# Patient Record
Sex: Female | Born: 1935
Health system: Southern US, Community
[De-identification: ages and names within clinical notes are randomized; demographics above are authoritative.]

## PROBLEM LIST (undated history)

## (undated) DIAGNOSIS — I10 Essential (primary) hypertension: Secondary | ICD-10-CM

## (undated) DIAGNOSIS — I219 Acute myocardial infarction, unspecified: Secondary | ICD-10-CM

## (undated) DIAGNOSIS — E162 Hypoglycemia, unspecified: Secondary | ICD-10-CM

## (undated) DIAGNOSIS — C801 Malignant (primary) neoplasm, unspecified: Secondary | ICD-10-CM

## (undated) DIAGNOSIS — I639 Cerebral infarction, unspecified: Secondary | ICD-10-CM

## (undated) HISTORY — PX: TONSILLECTOMY: SUR1361

## (undated) HISTORY — PX: BREAST SURGERY: SHX581

---

## 2011-04-03 DIAGNOSIS — I059 Rheumatic mitral valve disease, unspecified: Secondary | ICD-10-CM | POA: Diagnosis not present

## 2011-04-03 DIAGNOSIS — I10 Essential (primary) hypertension: Secondary | ICD-10-CM | POA: Diagnosis not present

## 2011-04-24 ENCOUNTER — Encounter (HOSPITAL_COMMUNITY): Payer: Self-pay

## 2011-04-24 ENCOUNTER — Emergency Department (HOSPITAL_COMMUNITY): Payer: Federal, State, Local not specified - PPO

## 2011-04-24 ENCOUNTER — Emergency Department (HOSPITAL_COMMUNITY)
Admission: EM | Admit: 2011-04-24 | Discharge: 2011-04-24 | Disposition: A | Discharge status: Other Acute Inpatient Hospital | Payer: Federal, State, Local not specified - PPO | Attending: Emergency Medicine | Admitting: Emergency Medicine

## 2011-04-24 ENCOUNTER — Other Ambulatory Visit: Payer: Self-pay

## 2011-04-24 DIAGNOSIS — Z853 Personal history of malignant neoplasm of breast: Secondary | ICD-10-CM | POA: Diagnosis not present

## 2011-04-24 DIAGNOSIS — Z882 Allergy status to sulfonamides status: Secondary | ICD-10-CM | POA: Diagnosis not present

## 2011-04-24 DIAGNOSIS — E162 Hypoglycemia, unspecified: Secondary | ICD-10-CM | POA: Diagnosis not present

## 2011-04-24 DIAGNOSIS — R918 Other nonspecific abnormal finding of lung field: Secondary | ICD-10-CM | POA: Diagnosis not present

## 2011-04-24 DIAGNOSIS — I214 Non-ST elevation (NSTEMI) myocardial infarction: Secondary | ICD-10-CM | POA: Diagnosis not present

## 2011-04-24 DIAGNOSIS — R0602 Shortness of breath: Secondary | ICD-10-CM | POA: Insufficient documentation

## 2011-04-24 DIAGNOSIS — Z91018 Allergy to other foods: Secondary | ICD-10-CM | POA: Diagnosis not present

## 2011-04-24 DIAGNOSIS — I2119 ST elevation (STEMI) myocardial infarction involving other coronary artery of inferior wall: Secondary | ICD-10-CM | POA: Diagnosis not present

## 2011-04-24 DIAGNOSIS — Z823 Family history of stroke: Secondary | ICD-10-CM | POA: Diagnosis not present

## 2011-04-24 DIAGNOSIS — R0609 Other forms of dyspnea: Secondary | ICD-10-CM | POA: Diagnosis not present

## 2011-04-24 DIAGNOSIS — R5381 Other malaise: Secondary | ICD-10-CM | POA: Insufficient documentation

## 2011-04-24 DIAGNOSIS — Z923 Personal history of irradiation: Secondary | ICD-10-CM | POA: Diagnosis not present

## 2011-04-24 DIAGNOSIS — I359 Nonrheumatic aortic valve disorder, unspecified: Secondary | ICD-10-CM | POA: Diagnosis not present

## 2011-04-24 DIAGNOSIS — Z880 Allergy status to penicillin: Secondary | ICD-10-CM | POA: Diagnosis not present

## 2011-04-24 DIAGNOSIS — I10 Essential (primary) hypertension: Secondary | ICD-10-CM | POA: Diagnosis not present

## 2011-04-24 DIAGNOSIS — R42 Dizziness and giddiness: Secondary | ICD-10-CM | POA: Insufficient documentation

## 2011-04-24 HISTORY — DX: Malignant (primary) neoplasm, unspecified: C80.1

## 2011-04-24 HISTORY — DX: Essential (primary) hypertension: I10

## 2011-04-24 LAB — URINALYSIS, ROUTINE W REFLEX MICROSCOPIC
Bilirubin Urine: NEGATIVE
Ketones, ur: NEGATIVE mg/dL
Nitrite: NEGATIVE
Urobilinogen, UA: 0.2 mg/dL (ref 0.0–1.0)

## 2011-04-24 LAB — URINE MICROSCOPIC-ADD ON

## 2011-04-24 LAB — BASIC METABOLIC PANEL
BUN: 13 mg/dL (ref 6–23)
Calcium: 9.2 mg/dL (ref 8.4–10.5)
Creatinine, Ser: 0.84 mg/dL (ref 0.50–1.10)
GFR calc Af Amer: 76 mL/min — ABNORMAL LOW (ref >90)
GFR calc non Af Amer: 66 mL/min — ABNORMAL LOW (ref >90)

## 2011-04-24 LAB — POCT I-STAT TROPONIN I

## 2011-04-24 LAB — DIFFERENTIAL
Basophils Relative: 0 % (ref 0–1)
Eosinophils Absolute: 0 10*3/uL (ref 0.0–0.7)
Monocytes Absolute: 1.5 10*3/uL — ABNORMAL HIGH (ref 0.1–1.0)
Monocytes Relative: 11 % (ref 3–12)

## 2011-04-24 LAB — CBC
HCT: 36.9 % (ref 36.0–46.0)
Hemoglobin: 12.4 g/dL (ref 12.0–15.0)
MCH: 29.2 pg (ref 26.0–34.0)
MCHC: 33.6 g/dL (ref 30.0–36.0)

## 2011-04-24 LAB — CARDIAC PANEL(CRET KIN+CKTOT+MB+TROPI): Troponin I: 9.85 ng/mL (ref <0.30)

## 2011-04-24 MED ORDER — HEPARIN BOLUS VIA INFUSION
4000.0000 [IU] | Freq: Once | INTRAVENOUS | Status: AC
  Administered 2011-04-24: 4000 [IU] via INTRAVENOUS

## 2011-04-24 MED ORDER — POTASSIUM CHLORIDE CRYS ER 20 MEQ PO TBCR
40.0000 meq | EXTENDED_RELEASE_TABLET | Freq: Once | ORAL | Status: AC
  Administered 2011-04-24: 40 meq via ORAL
  Filled 2011-04-24: qty 2

## 2011-04-24 MED ORDER — ASPIRIN 81 MG PO CHEW
324.0000 mg | CHEWABLE_TABLET | Freq: Once | ORAL | Status: AC
  Administered 2011-04-24: 324 mg via ORAL
  Filled 2011-04-24: qty 1

## 2011-04-24 MED ORDER — VITAMIN D3 25 MCG (1000 UNIT) PO TABS
1000.0000 [IU] | ORAL_TABLET | Freq: Every day | ORAL | Status: DC
  Administered 2011-04-24: 1000 [IU] via ORAL
  Filled 2011-04-24: qty 1

## 2011-04-24 MED ORDER — METOPROLOL SUCCINATE ER 50 MG PO TB24
75.0000 mg | ORAL_TABLET | Freq: Every day | ORAL | Status: DC
  Filled 2011-04-24: qty 1

## 2011-04-24 MED ORDER — HEPARIN (PORCINE) IN NACL 100-0.45 UNIT/ML-% IJ SOLN
1000.0000 [IU]/h | INTRAMUSCULAR | Status: DC
  Administered 2011-04-24: 1000 [IU]/h via INTRAVENOUS
  Filled 2011-04-24: qty 250

## 2011-04-24 MED ORDER — ASPIRIN EC 81 MG PO TBEC
81.0000 mg | DELAYED_RELEASE_TABLET | Freq: Every day | ORAL | Status: DC
  Filled 2011-04-24: qty 1

## 2011-04-24 MED ORDER — SODIUM CHLORIDE 0.9 % IV SOLN
INTRAVENOUS | Status: DC
  Administered 2011-04-24: 09:00:00 via INTRAVENOUS

## 2011-04-24 MED ORDER — NITROGLYCERIN 2 % TD OINT
1.0000 [in_us] | TOPICAL_OINTMENT | Freq: Once | TRANSDERMAL | Status: AC
  Administered 2011-04-24: 1 [in_us] via TOPICAL
  Filled 2011-04-24: qty 30

## 2011-04-24 NOTE — ED Notes (Signed)
Pt will be transferred to [facility] CCU---Dr. Misty Stanley.  Report given to [facility], Charity fundraiser.

## 2011-04-24 NOTE — ED Provider Notes (Signed)
History     CSN: [id number]  Arrival date & time 04/24/11  4098   First MD Initiated Contact with Patient 04/24/11 [phone]      Chief Complaint  Patient presents with  . Weakness    (Consider location/radiation/quality/duration/timing/severity/associated sxs/prior treatment) Patient is a 78 y.o. female presenting with weakness. The history is provided by the patient.  Weakness The primary symptoms include nausea. Primary symptoms do not include headaches, fever or vomiting.  Additional symptoms include weakness.   the patient is a 78 year old female, with a history of hypertension.  She presents to the emergency department complaining of a feeling as if she will pass out, along with weakness.  Her symptoms began 2 days ago in the morning after she will call.  She was walking to the bathroom and felt as if she will pass out.  She also had pain in her tooth and left elbow.  She has had decreased appetite for the past 2 days.  Today.  She was walking to work.  She felt nauseated, and had shortness of breath.  She says she had to stop and sit down on the ground and then, her symptoms resolved.  She denied chest pain or pain anywhere else.  She also says that she has been diaphoretic.  While being at home.  She denies cough, fevers, chills, urinary tract symptoms, vomiting, or diarrhea.  She does not smoke.  She denies diabetes.  She denies prior history of coronary artery disease, or congestive heart failure.  She takes aspirin daily.  Her last aspirin was last night at 10:30.  No past medical history on file.  No past surgical history on file.  No family history on file.  History  Substance Use Topics  . Smoking status: Not on file  . Smokeless tobacco: Not on file  . Alcohol Use: Not on file    OB History    No data available      Review of Systems  Constitutional: Positive for diaphoresis. Negative for fever and chills.  HENT: Negative for congestion.   Respiratory: Positive for  shortness of breath. Negative for cough and chest tightness.        Dyspnea on exertion  Cardiovascular: Negative for chest pain, palpitations and leg swelling.  Gastrointestinal: Positive for nausea. Negative for vomiting, abdominal pain and diarrhea.  Genitourinary: Negative for dysuria.  Neurological: Positive for weakness and light-headedness. Negative for headaches.  Psychiatric/Behavioral: Negative for confusion.  All other systems reviewed and are negative.    Allergies  Ampicillin; Penicillins; and Sulfa drugs cross reactors  Home Medications  No current outpatient prescriptions on file.  BP 98/44  Pulse 64  Temp(Src) 98.2 F (36.8 C) (Oral)  Resp 20  SpO2 97%  Physical Exam  Vitals reviewed. Constitutional: She is oriented to person, place, and time. She appears well-developed and well-nourished. No distress.  HENT:  Head: Normocephalic and atraumatic.  Eyes: Conjunctivae are normal. Pupils are equal, round, and reactive to light.  Neck: Normal range of motion.  Cardiovascular: Normal rate and regular rhythm.   Pulmonary/Chest: Effort normal and breath sounds normal. No respiratory distress. She has no wheezes. She has no rales.  Abdominal: Soft. Bowel sounds are normal. She exhibits no distension. There is no tenderness.  Musculoskeletal: Normal range of motion. She exhibits no edema.  Neurological: She is alert and oriented to person, place, and time. No cranial nerve deficit.  Skin: Skin is warm and dry.  Psychiatric: She has a normal mood  and affect. Judgment and thought content normal.    ED Course  Procedures (including critical care time) 78 year old female, with hypertension, presents with generalized weakness, dyspnea on exertion, and nausea.  Symptoms resolved after resting.  She has no history of coronary disease.  She did have pain in her tooth in the left elbow 2 days ago, as well.  We will do generalized evaluation for weakness, with blood test, and a  urinalysis, but now also concerned with her dyspnea on exertion.  That resolved with resting.  This may be a sign of acute coronary syndrome in an elderly patient.  We will do an EKG, and cardiac enzymes, and a chest x-ray, as well.  Presently, she is asymptomatic, so.  No medical therapy is indicated at this time.  She has mild hypotension, perhaps due to the diuretic that she takes.  We will treat her with IV fluids while she is in emergency department.     Labs Reviewed  CBC  DIFFERENTIAL  BASIC METABOLIC PANEL  URINALYSIS, ROUTINE W REFLEX MICROSCOPIC  CARDIAC PANEL(CRET KIN+CKTOT+MB+TROPI)   ED ECG REPORT   Date: 04/24/2011  EKG Time: 8:17 AM  Rate: 65  Rhythm: normal sinus rhythm,    Axis: nl  Intervals:none  ST&T Change: tw inversions inferior leads  Narrative Interpretation: nsr with inf q waves and tw inversion inferiorly       9:04 AM Spoke with [facility] about NSTEMI.  They will come admit.  Discussed with pt. She still has NO CP.  Will give asa, heparin, nitro oint to chest wall.     No diagnosis found.  CRITICAL CARE Performed by: Nicholes Stairs   Total critical care time: 30 min  Critical care time was exclusive of separately billable procedures and treating other patients.  Critical care was necessary to treat or prevent imminent or life-threatening deterioration.  Critical care was time spent personally by me on the following activities: development of treatment plan with patient and/or surrogate as well as nursing, discussions with consultants, evaluation of patient's response to treatment, examination of patient, obtaining history from patient or surrogate, ordering and performing treatments and interventions, ordering and review of laboratory studies, ordering and review of radiographic studies, pulse oximetry and re-evaluation of patient's condition.   MDM  NSTEMI Currently no cp.   Mild hypotension.         Nicholes Stairs,  MD 04/24/11 [phone]

## 2011-04-24 NOTE — H&P (Addendum)
History and Physical  Patient ID: Kaitlyn Burke Patient ID: Kaitlyn Burke MRN: [id number], DOB/AGE: [birth year] 76 y.o. Date of Encounter: 04/24/2011  Primary Physician: No primary provider on file. Primary Cardiologist: Dr. Therisa Burke, [facility]  Chief Complaint: weakness and SOB on exertion  HPI: 78 y/o female with no prior h/o cardiac disease, s/p remote cardiac catheterization. She was in her usual state of health until 04/22/11 when she awoke at 4 am with pain in her L upper jaw and L elbow pain, nausea but w/o vomiting, and dizzy/presyncopal. She was u/a to get OOB to go to the bathroom because she felt so nauseated and weak. The symptoms improved some after several hours without intervention, but she continued to feel poorly, with generalized weakness and nausea. Since that time, she has had decreased appetite and energy level. She did not go to work yesterday because she continued feeling poorly. This morning she was walking about a block to work and had onset of weakness and SOB. She was unable to continue and sxs improved after she sat to rest. A co-worker called EMS. Her CEs were elevated in the ED at [facility]. She was treated with 324 mg of ASA, Heparin, and Nitro 2% ointment. She is asymptomatic with exception of some SOB, now on 2L O2 [place]. Her EKG shows Q waves in the inferior leads, II, III, and AvF. She has never had sxs like this before. At no point did she have chest pain. She has never had chest pain.  Of note, she last saw Dr. Claris Burke on 04/03/11. [facility] contacted Dr. Selena Batten [phone] who is on-call for Dr. Claris Burke to discuss transfer.  Hx:                                                                  SEM Breast CA HTN Hyperlipidemia     Surgical History: Lumpectomy, choley, uterine polyp, T&A, heart cath - no obstructive disease.  I have reviewed the patient's current medications. Scheduled Meds:   . aspirin  324 mg Oral Once  . heparin  4,000  Units Intravenous Once  . nitroGLYCERIN  1 inch Topical Once   Continuous Infusions:   . sodium chloride 125 mL/hr at 04/24/11 0851  . heparin 1,000 Units/hr (04/24/11 0948)   Medications Prior to Admission  Medication Dose Route Frequency Provider Last Rate Last Dose  . ASA 81 mg        .         .         .         .         .         .         .          No current outpatient prescriptions on file as of 04/24/2011.     Allergies:  Allergies  Allergen Reactions  . Ampicillin Rash  . Penicillins Rash  . Sulfa Drugs Cross Reactors Rash    History   Social History  . Marital Status: Widow    Spouse Name: N/A    Number of Children: N/A  . Years of Education: N/A   Occupational History  . Works at [employer]  Social History Main Topics  . Smoking status: No  . Smokeless tobacco: No  . Alcohol Use: No  . Drug Use:   . Sexually Active:    Social History Narrative  . Lives alone in [place]     Family history: Mother deceased after stroke, Father died at 44, siblings no CAD/CVA   Physical Exam: Blood pressure 98/44, pulse 64, temperature 98.2 F (36.8 C), temperature source Oral, resp. rate 20, weight 152 lb (68.947 kg), SpO2 97.00%. General: Well developed, well nourished, in no acute distress. Head: Normocephalic, atraumatic, sclera non-icteric, no xanthomas, nares are without discharge.  Neck:carotid bruits. JVD not elevated. No thyromegally Lungs: Clear bilaterally to auscultation without wheezes, rales, or rhonchi.  Heart: RRR with S1 S2. No murmurs, rubs, or gallops appreciated. Abdomen: Soft, non-tender, non-distended with normoactive bowel sounds. No hepatomegaly. No rebound/guarding. No obvious abdominal masses. Msk:  Strength and tone appear normal for age. No joint deformities or effusions, no spine or costo-vertebral angle tenderness. Extremities: No clubbing or cyanosis. No edema.  Distal pedal pulses are 2+ and equal bilaterally. Neuro:  Alert and oriented X 3. Moves all extremities spontaneously. No focal deficits noted. Psych:  Responds to questions appropriately with a normal affect. Skin: No rashes or lesions noted  Review of Systems:  All other systems reviewed and are otherwise negative except as noted above.  Labs:   Lab Results  Component Value Date   WBC 12.9* 04/24/2011   HGB 12.4 04/24/2011   HCT 36.9 04/24/2011   MCV 87.0 04/24/2011   PLT 222 04/24/2011   No results found for this basename: INR in the last 72 hours  Lab 04/24/11 0820  NA 136  K 3.2*  CL 99  CO2 28  BUN 13  CREATININE 0.84  CALCIUM 9.2  PROT --  BILITOT --  ALKPHOS --  ALT --  AST --  GLUCOSE 119*    Basename 04/24/11 0820  CKTOTAL 588*  CKMB 10.3*  TROPONINI 9.85*   Radiology/Studies:  Dg Chest Port 1 View  04/24/2011  *RADIOLOGY REPORT*  Clinical Data: Hypoglycemia  PORTABLE CHEST - 1 VIEW  Comparison: None.  Findings: Normal mediastinum and heart silhouette. There is a faint opacity in the right lower lobe inferolaterally.  No focal consolidation.  No pneumothorax.  No pulmonary edema.  No acute osseous abnormality.   IMPRESSION: Faint opacity in the right lower lobe.  Cannot exclude early pneumonia.  Original Report Authenticated By: Kaitlyn Burke, M.D.    EKG: SR, rate 65BPM with inferior Q waves and inferior T wave changes - no old available for comparison  ASSESSMENT AND PLAN:  1. NSTEMI: Medical Rx for now with ASA, Heparin, NTG, oxygen. 2. abnl CXR - do 2-view.  SignedBjorn Loser Jabari Swoveland PA-C 04/24/2011, 10:06 AM  Cardiology Attending Patient seen and examined. I have reviewed the history and physical exam as noted by Kaitlyn Demark, PA-C. She almost certainly sustained a MI 3-4 days ago, and is now pain free. She has an elevated troponin and inferior Q's with minimal residual ST elevation. She has mild post-infarction sob but is not in overt CHF. Otherwise her exam is unremarkable. She has requested transfer to  [facility] Dr. Claris Burke for post-MI care. Will transfer once a bed is available. Will continue meds as outlined above.  **Spoke with Kaitlyn Burke at [facility]. Pt has a bed in CCU 11 at [facility]. Called [facility] and Pam (her RN at [employer]) to let them know. Will hold off on 2-View CXR here  and let them evaluate her at [facility] for abnormal CXR. The accepting MD is aware. Kaitlyn Loser Annisha Baar 04/24/2011 11:50 AM

## 2011-04-24 NOTE — Progress Notes (Signed)
ANTICOAGULATION CONSULT NOTE - Initial Consult  Pharmacy Consult for Heparin Indication: chest pain/ACS  Allergies  Allergen Reactions  . Ampicillin Rash  . Penicillins Rash  . Sulfa Drugs Cross Reactors Rash    Patient Measurements: Weight: 152 lb (68.947 kg)  Vital Signs: Temp: 98.2 F (36.8 C) (01/29 0731) Temp src: Oral (01/29 0731) BP: 98/44 mmHg (01/29 0731) Pulse Rate: 64  (01/29 0731)  Labs:  Basename 04/24/11 0820  HGB 12.4  HCT 36.9  PLT 222  APTT --  LABPROT --  INR --  HEPARINUNFRC --  CREATININE 0.84  CKTOTAL --  CKMB --  TROPONINI --   CrCl is unknown because there is no height on file for the current visit.  Medical History: Hypertension  Medications:  Scheduled:    . aspirin  324 mg Oral Once  . heparin  4,000 Units Intravenous Once  . nitroGLYCERIN  1 inch Topical Once   Infusions:    . sodium chloride 125 mL/hr at 04/24/11 0851  . heparin      Assessment: 78 yo female with elevated troponin 10.65 (poc ED). Complaint of nausea,SOB,diaphoresis,left elbow and jaw pain. Sx began two days ago.  Goal of Therapy:  Heparin level 0.3-0.7 units/ml   Plan:  Heparin 4000 unit load and infusion at 1000 units/hr per ER MD. (Calculated infusion rate 828 units/hr at 12 units/kg/hr). Heparin level in 8 hr and daily ordered. Daily CBC while on heparin.   Otho Bellows PharmD 04/24/2011,9:24 AM

## 2011-04-24 NOTE — ED Notes (Signed)
[id number]<AV> Expected date:04/24/11<BR> Expected time: 7:05 AM<BR> Means of arrival:Ambulance<BR> Comments:<BR> flulike sx

## 2011-04-24 NOTE — ED Notes (Signed)
Pt began not feeling well over the weekend....nausea---no vomiting; diarrhea, cold symptoms, and weakness.  Pt attempted to go to work today, but was so weak that she could not walk the entire distance to her office.  She had to sit down, and a co-worker called EMS.  She has not been eating or drinking well during this time.  Left elbow has been hurting, as well as left jaw.  Pt has  A hx of a heart murmur.  She did c/o heartburn yesterday.  No SOB.

## 2011-04-24 NOTE — ED Notes (Signed)
[facility] has possession of the patient.  [place] forms sent and patient signature sheet sent approving the transfer.  The transfer sheet was signed electronically as well, but would not print. CCU at [facility] notified that the pt is on the way.

## 2011-04-24 NOTE — ED Notes (Signed)
MD Caporossi made aware of critical i-Stat troponin value.

## 2011-04-24 NOTE — ED Notes (Signed)
Urine was a clean catch----not cath.

## 2011-06-09 DIAGNOSIS — R0602 Shortness of breath: Secondary | ICD-10-CM | POA: Diagnosis not present

## 2011-06-09 DIAGNOSIS — R209 Unspecified disturbances of skin sensation: Secondary | ICD-10-CM | POA: Diagnosis not present

## 2011-06-09 DIAGNOSIS — I2119 ST elevation (STEMI) myocardial infarction involving other coronary artery of inferior wall: Secondary | ICD-10-CM | POA: Diagnosis not present

## 2011-06-09 DIAGNOSIS — J019 Acute sinusitis, unspecified: Secondary | ICD-10-CM | POA: Diagnosis not present

## 2011-06-19 DIAGNOSIS — I10 Essential (primary) hypertension: Secondary | ICD-10-CM | POA: Diagnosis not present

## 2014-07-05 DIAGNOSIS — H35033 Hypertensive retinopathy, bilateral: Secondary | ICD-10-CM | POA: Diagnosis not present

## 2014-07-05 DIAGNOSIS — H2513 Age-related nuclear cataract, bilateral: Secondary | ICD-10-CM | POA: Diagnosis not present

## 2014-07-05 DIAGNOSIS — H04123 Dry eye syndrome of bilateral lacrimal glands: Secondary | ICD-10-CM | POA: Diagnosis not present

## 2014-07-23 DIAGNOSIS — Z1231 Encounter for screening mammogram for malignant neoplasm of breast: Secondary | ICD-10-CM | POA: Diagnosis not present

## 2014-08-12 DIAGNOSIS — Z23 Encounter for immunization: Secondary | ICD-10-CM | POA: Diagnosis not present

## 2014-08-12 DIAGNOSIS — E780 Pure hypercholesterolemia: Secondary | ICD-10-CM | POA: Diagnosis not present

## 2014-08-12 DIAGNOSIS — I10 Essential (primary) hypertension: Secondary | ICD-10-CM | POA: Diagnosis not present

## 2014-08-12 DIAGNOSIS — M8580 Other specified disorders of bone density and structure, unspecified site: Secondary | ICD-10-CM | POA: Diagnosis not present

## 2014-08-12 DIAGNOSIS — I252 Old myocardial infarction: Secondary | ICD-10-CM | POA: Diagnosis not present

## 2014-08-19 DIAGNOSIS — Z23 Encounter for immunization: Secondary | ICD-10-CM | POA: Diagnosis not present

## 2014-10-22 DIAGNOSIS — M79602 Pain in left arm: Secondary | ICD-10-CM | POA: Diagnosis not present

## 2014-10-22 DIAGNOSIS — M5030 Other cervical disc degeneration, unspecified cervical region: Secondary | ICD-10-CM | POA: Diagnosis not present

## 2014-10-22 DIAGNOSIS — M47812 Spondylosis without myelopathy or radiculopathy, cervical region: Secondary | ICD-10-CM | POA: Diagnosis not present

## 2014-10-22 DIAGNOSIS — R200 Anesthesia of skin: Secondary | ICD-10-CM | POA: Diagnosis not present

## 2014-10-22 DIAGNOSIS — M4722 Other spondylosis with radiculopathy, cervical region: Secondary | ICD-10-CM | POA: Diagnosis not present

## 2014-10-22 DIAGNOSIS — M542 Cervicalgia: Secondary | ICD-10-CM | POA: Diagnosis not present

## 2014-10-22 DIAGNOSIS — K219 Gastro-esophageal reflux disease without esophagitis: Secondary | ICD-10-CM | POA: Diagnosis not present

## 2014-11-12 DIAGNOSIS — Z170 Estrogen receptor positive status [ER+]: Secondary | ICD-10-CM | POA: Diagnosis not present

## 2014-11-12 DIAGNOSIS — C50212 Malignant neoplasm of upper-inner quadrant of left female breast: Secondary | ICD-10-CM | POA: Diagnosis not present

## 2014-11-12 DIAGNOSIS — R202 Paresthesia of skin: Secondary | ICD-10-CM | POA: Diagnosis not present

## 2014-11-12 DIAGNOSIS — Z853 Personal history of malignant neoplasm of breast: Secondary | ICD-10-CM | POA: Diagnosis not present

## 2014-11-12 DIAGNOSIS — Z08 Encounter for follow-up examination after completed treatment for malignant neoplasm: Secondary | ICD-10-CM | POA: Diagnosis not present

## 2014-12-21 DIAGNOSIS — M5033 Other cervical disc degeneration, cervicothoracic region: Secondary | ICD-10-CM | POA: Diagnosis not present

## 2014-12-21 DIAGNOSIS — M47812 Spondylosis without myelopathy or radiculopathy, cervical region: Secondary | ICD-10-CM | POA: Diagnosis not present

## 2014-12-21 DIAGNOSIS — M5032 Other cervical disc degeneration, mid-cervical region: Secondary | ICD-10-CM | POA: Diagnosis not present

## 2014-12-21 DIAGNOSIS — M5031 Other cervical disc degeneration,  high cervical region: Secondary | ICD-10-CM | POA: Diagnosis not present

## 2014-12-21 DIAGNOSIS — M4802 Spinal stenosis, cervical region: Secondary | ICD-10-CM | POA: Diagnosis not present

## 2015-01-18 DIAGNOSIS — J3489 Other specified disorders of nose and nasal sinuses: Secondary | ICD-10-CM | POA: Diagnosis not present

## 2015-01-18 DIAGNOSIS — J029 Acute pharyngitis, unspecified: Secondary | ICD-10-CM | POA: Diagnosis not present

## 2015-01-18 DIAGNOSIS — K210 Gastro-esophageal reflux disease with esophagitis: Secondary | ICD-10-CM | POA: Diagnosis not present

## 2015-01-18 DIAGNOSIS — R142 Eructation: Secondary | ICD-10-CM | POA: Diagnosis not present

## 2015-01-20 DIAGNOSIS — K219 Gastro-esophageal reflux disease without esophagitis: Secondary | ICD-10-CM | POA: Diagnosis not present

## 2015-01-20 DIAGNOSIS — J029 Acute pharyngitis, unspecified: Secondary | ICD-10-CM | POA: Diagnosis not present

## 2015-01-20 DIAGNOSIS — K5150 Left sided colitis without complications: Secondary | ICD-10-CM | POA: Diagnosis not present

## 2015-02-03 DIAGNOSIS — K2950 Unspecified chronic gastritis without bleeding: Secondary | ICD-10-CM | POA: Diagnosis not present

## 2015-02-03 DIAGNOSIS — D124 Benign neoplasm of descending colon: Secondary | ICD-10-CM | POA: Diagnosis not present

## 2015-02-03 DIAGNOSIS — K5190 Ulcerative colitis, unspecified, without complications: Secondary | ICD-10-CM | POA: Diagnosis not present

## 2015-02-03 DIAGNOSIS — D123 Benign neoplasm of transverse colon: Secondary | ICD-10-CM | POA: Diagnosis not present

## 2015-02-03 DIAGNOSIS — K219 Gastro-esophageal reflux disease without esophagitis: Secondary | ICD-10-CM | POA: Diagnosis not present

## 2015-02-03 DIAGNOSIS — D122 Benign neoplasm of ascending colon: Secondary | ICD-10-CM | POA: Diagnosis not present

## 2015-02-03 DIAGNOSIS — Z86010 Personal history of colonic polyps: Secondary | ICD-10-CM | POA: Diagnosis not present

## 2015-02-08 DIAGNOSIS — Z880 Allergy status to penicillin: Secondary | ICD-10-CM | POA: Diagnosis not present

## 2015-02-08 DIAGNOSIS — I4510 Unspecified right bundle-branch block: Secondary | ICD-10-CM | POA: Diagnosis not present

## 2015-02-08 DIAGNOSIS — I10 Essential (primary) hypertension: Secondary | ICD-10-CM | POA: Diagnosis not present

## 2015-02-08 DIAGNOSIS — Z79899 Other long term (current) drug therapy: Secondary | ICD-10-CM | POA: Diagnosis not present

## 2015-02-08 DIAGNOSIS — Z888 Allergy status to other drugs, medicaments and biological substances status: Secondary | ICD-10-CM | POA: Diagnosis not present

## 2015-02-08 DIAGNOSIS — Z882 Allergy status to sulfonamides status: Secondary | ICD-10-CM | POA: Diagnosis not present

## 2015-02-08 DIAGNOSIS — R012 Other cardiac sounds: Secondary | ICD-10-CM | POA: Diagnosis not present

## 2015-02-08 DIAGNOSIS — Z7982 Long term (current) use of aspirin: Secondary | ICD-10-CM | POA: Diagnosis not present

## 2015-03-17 DIAGNOSIS — I10 Essential (primary) hypertension: Secondary | ICD-10-CM | POA: Diagnosis not present

## 2015-03-17 DIAGNOSIS — M2560 Stiffness of unspecified joint, not elsewhere classified: Secondary | ICD-10-CM | POA: Diagnosis not present

## 2015-03-17 DIAGNOSIS — M899 Disorder of bone, unspecified: Secondary | ICD-10-CM | POA: Diagnosis not present

## 2015-03-17 DIAGNOSIS — M8580 Other specified disorders of bone density and structure, unspecified site: Secondary | ICD-10-CM | POA: Diagnosis not present

## 2015-03-17 DIAGNOSIS — Z0001 Encounter for general adult medical examination with abnormal findings: Secondary | ICD-10-CM | POA: Diagnosis not present

## 2015-03-17 DIAGNOSIS — R828 Abnormal findings on cytological and histological examination of urine: Secondary | ICD-10-CM | POA: Diagnosis not present

## 2015-03-17 DIAGNOSIS — Z0000 Encounter for general adult medical examination without abnormal findings: Secondary | ICD-10-CM | POA: Diagnosis not present

## 2015-03-17 DIAGNOSIS — M25512 Pain in left shoulder: Secondary | ICD-10-CM | POA: Diagnosis not present

## 2015-03-17 DIAGNOSIS — K210 Gastro-esophageal reflux disease with esophagitis: Secondary | ICD-10-CM | POA: Diagnosis not present

## 2015-03-17 DIAGNOSIS — F909 Attention-deficit hyperactivity disorder, unspecified type: Secondary | ICD-10-CM | POA: Diagnosis not present

## 2015-03-17 DIAGNOSIS — R5383 Other fatigue: Secondary | ICD-10-CM | POA: Diagnosis not present

## 2015-03-17 DIAGNOSIS — E559 Vitamin D deficiency, unspecified: Secondary | ICD-10-CM | POA: Diagnosis not present

## 2015-03-17 DIAGNOSIS — Z1289 Encounter for screening for malignant neoplasm of other sites: Secondary | ICD-10-CM | POA: Diagnosis not present

## 2015-03-30 DIAGNOSIS — Z780 Asymptomatic menopausal state: Secondary | ICD-10-CM | POA: Diagnosis not present

## 2015-03-30 DIAGNOSIS — M899 Disorder of bone, unspecified: Secondary | ICD-10-CM | POA: Diagnosis not present

## 2015-07-21 DIAGNOSIS — H2512 Age-related nuclear cataract, left eye: Secondary | ICD-10-CM | POA: Diagnosis not present

## 2015-07-21 DIAGNOSIS — G43809 Other migraine, not intractable, without status migrainosus: Secondary | ICD-10-CM | POA: Diagnosis not present

## 2015-07-21 DIAGNOSIS — H35033 Hypertensive retinopathy, bilateral: Secondary | ICD-10-CM | POA: Diagnosis not present

## 2015-07-21 DIAGNOSIS — H04123 Dry eye syndrome of bilateral lacrimal glands: Secondary | ICD-10-CM | POA: Diagnosis not present

## 2015-07-28 DIAGNOSIS — C50212 Malignant neoplasm of upper-inner quadrant of left female breast: Secondary | ICD-10-CM | POA: Diagnosis not present

## 2015-07-28 DIAGNOSIS — Z1231 Encounter for screening mammogram for malignant neoplasm of breast: Secondary | ICD-10-CM | POA: Diagnosis not present

## 2015-08-17 DIAGNOSIS — H2512 Age-related nuclear cataract, left eye: Secondary | ICD-10-CM | POA: Diagnosis not present

## 2015-09-14 DIAGNOSIS — E7800 Pure hypercholesterolemia, unspecified: Secondary | ICD-10-CM | POA: Diagnosis not present

## 2015-09-14 DIAGNOSIS — K210 Gastro-esophageal reflux disease with esophagitis: Secondary | ICD-10-CM | POA: Diagnosis not present

## 2015-09-14 DIAGNOSIS — I10 Essential (primary) hypertension: Secondary | ICD-10-CM | POA: Diagnosis not present

## 2015-09-14 DIAGNOSIS — R7309 Other abnormal glucose: Secondary | ICD-10-CM | POA: Diagnosis not present

## 2015-09-14 DIAGNOSIS — M8580 Other specified disorders of bone density and structure, unspecified site: Secondary | ICD-10-CM | POA: Diagnosis not present

## 2015-09-20 DIAGNOSIS — Z170 Estrogen receptor positive status [ER+]: Secondary | ICD-10-CM | POA: Diagnosis not present

## 2015-09-20 DIAGNOSIS — C50212 Malignant neoplasm of upper-inner quadrant of left female breast: Secondary | ICD-10-CM | POA: Diagnosis not present

## 2015-09-20 DIAGNOSIS — Z8041 Family history of malignant neoplasm of ovary: Secondary | ICD-10-CM | POA: Diagnosis not present

## 2015-09-20 DIAGNOSIS — Z9011 Acquired absence of right breast and nipple: Secondary | ICD-10-CM | POA: Diagnosis not present

## 2015-09-20 DIAGNOSIS — Z803 Family history of malignant neoplasm of breast: Secondary | ICD-10-CM | POA: Diagnosis not present

## 2015-10-19 DIAGNOSIS — Z880 Allergy status to penicillin: Secondary | ICD-10-CM | POA: Diagnosis not present

## 2015-10-19 DIAGNOSIS — Z882 Allergy status to sulfonamides status: Secondary | ICD-10-CM | POA: Diagnosis not present

## 2015-10-19 DIAGNOSIS — H2512 Age-related nuclear cataract, left eye: Secondary | ICD-10-CM | POA: Diagnosis not present

## 2015-10-19 DIAGNOSIS — I10 Essential (primary) hypertension: Secondary | ICD-10-CM | POA: Diagnosis not present

## 2015-10-19 DIAGNOSIS — Z79899 Other long term (current) drug therapy: Secondary | ICD-10-CM | POA: Diagnosis not present

## 2015-10-19 DIAGNOSIS — I252 Old myocardial infarction: Secondary | ICD-10-CM | POA: Diagnosis not present

## 2015-10-19 DIAGNOSIS — E785 Hyperlipidemia, unspecified: Secondary | ICD-10-CM | POA: Diagnosis not present

## 2015-10-19 DIAGNOSIS — I2510 Atherosclerotic heart disease of native coronary artery without angina pectoris: Secondary | ICD-10-CM | POA: Diagnosis not present

## 2015-10-19 DIAGNOSIS — K219 Gastro-esophageal reflux disease without esophagitis: Secondary | ICD-10-CM | POA: Diagnosis not present

## 2015-10-19 DIAGNOSIS — Z886 Allergy status to analgesic agent status: Secondary | ICD-10-CM | POA: Diagnosis not present

## 2015-10-19 DIAGNOSIS — H5703 Miosis: Secondary | ICD-10-CM | POA: Diagnosis not present

## 2015-10-31 DIAGNOSIS — I2510 Atherosclerotic heart disease of native coronary artery without angina pectoris: Secondary | ICD-10-CM | POA: Diagnosis not present

## 2015-10-31 DIAGNOSIS — R300 Dysuria: Secondary | ICD-10-CM | POA: Diagnosis not present

## 2015-10-31 DIAGNOSIS — I10 Essential (primary) hypertension: Secondary | ICD-10-CM | POA: Diagnosis not present

## 2015-10-31 DIAGNOSIS — N390 Urinary tract infection, site not specified: Secondary | ICD-10-CM | POA: Diagnosis not present

## 2015-11-18 ENCOUNTER — Other Ambulatory Visit: Payer: Self-pay

## 2015-12-05 DIAGNOSIS — N390 Urinary tract infection, site not specified: Secondary | ICD-10-CM | POA: Diagnosis not present

## 2015-12-06 DIAGNOSIS — R300 Dysuria: Secondary | ICD-10-CM | POA: Diagnosis not present

## 2015-12-20 DIAGNOSIS — N952 Postmenopausal atrophic vaginitis: Secondary | ICD-10-CM | POA: Diagnosis not present

## 2015-12-20 DIAGNOSIS — N390 Urinary tract infection, site not specified: Secondary | ICD-10-CM | POA: Diagnosis not present

## 2016-01-26 DIAGNOSIS — Z23 Encounter for immunization: Secondary | ICD-10-CM | POA: Diagnosis not present

## 2016-02-07 DIAGNOSIS — Z7982 Long term (current) use of aspirin: Secondary | ICD-10-CM | POA: Diagnosis not present

## 2016-02-07 DIAGNOSIS — R079 Chest pain, unspecified: Secondary | ICD-10-CM | POA: Diagnosis not present

## 2016-02-07 DIAGNOSIS — I10 Essential (primary) hypertension: Secondary | ICD-10-CM | POA: Diagnosis not present

## 2016-02-07 DIAGNOSIS — Z79899 Other long term (current) drug therapy: Secondary | ICD-10-CM | POA: Diagnosis not present

## 2016-02-07 DIAGNOSIS — I4510 Unspecified right bundle-branch block: Secondary | ICD-10-CM | POA: Diagnosis not present

## 2016-03-16 DIAGNOSIS — M899 Disorder of bone, unspecified: Secondary | ICD-10-CM | POA: Diagnosis not present

## 2016-03-16 DIAGNOSIS — M8580 Other specified disorders of bone density and structure, unspecified site: Secondary | ICD-10-CM | POA: Diagnosis not present

## 2016-03-16 DIAGNOSIS — E559 Vitamin D deficiency, unspecified: Secondary | ICD-10-CM | POA: Diagnosis not present

## 2016-03-16 DIAGNOSIS — R5383 Other fatigue: Secondary | ICD-10-CM | POA: Diagnosis not present

## 2016-03-16 DIAGNOSIS — I10 Essential (primary) hypertension: Secondary | ICD-10-CM | POA: Diagnosis not present

## 2016-03-16 DIAGNOSIS — R202 Paresthesia of skin: Secondary | ICD-10-CM | POA: Diagnosis not present

## 2016-03-16 DIAGNOSIS — E7800 Pure hypercholesterolemia, unspecified: Secondary | ICD-10-CM | POA: Diagnosis not present

## 2016-03-16 DIAGNOSIS — Z0000 Encounter for general adult medical examination without abnormal findings: Secondary | ICD-10-CM | POA: Diagnosis not present

## 2016-05-03 DIAGNOSIS — H838X3 Other specified diseases of inner ear, bilateral: Secondary | ICD-10-CM | POA: Diagnosis not present

## 2016-05-03 DIAGNOSIS — H903 Sensorineural hearing loss, bilateral: Secondary | ICD-10-CM | POA: Diagnosis not present

## 2016-05-04 DIAGNOSIS — H04123 Dry eye syndrome of bilateral lacrimal glands: Secondary | ICD-10-CM | POA: Diagnosis not present

## 2016-05-04 DIAGNOSIS — H35033 Hypertensive retinopathy, bilateral: Secondary | ICD-10-CM | POA: Diagnosis not present

## 2016-05-04 DIAGNOSIS — Z961 Presence of intraocular lens: Secondary | ICD-10-CM | POA: Diagnosis not present

## 2016-05-15 DIAGNOSIS — Z79899 Other long term (current) drug therapy: Secondary | ICD-10-CM | POA: Diagnosis not present

## 2016-05-15 DIAGNOSIS — Z888 Allergy status to other drugs, medicaments and biological substances status: Secondary | ICD-10-CM | POA: Diagnosis not present

## 2016-05-15 DIAGNOSIS — Z880 Allergy status to penicillin: Secondary | ICD-10-CM | POA: Diagnosis not present

## 2016-05-15 DIAGNOSIS — Z7982 Long term (current) use of aspirin: Secondary | ICD-10-CM | POA: Diagnosis not present

## 2016-05-15 DIAGNOSIS — Z882 Allergy status to sulfonamides status: Secondary | ICD-10-CM | POA: Diagnosis not present

## 2016-05-15 DIAGNOSIS — I4510 Unspecified right bundle-branch block: Secondary | ICD-10-CM | POA: Diagnosis not present

## 2016-05-15 DIAGNOSIS — I10 Essential (primary) hypertension: Secondary | ICD-10-CM | POA: Diagnosis not present

## 2016-07-16 DIAGNOSIS — R350 Frequency of micturition: Secondary | ICD-10-CM | POA: Diagnosis not present

## 2016-07-16 DIAGNOSIS — R3915 Urgency of urination: Secondary | ICD-10-CM | POA: Diagnosis not present

## 2016-07-25 DIAGNOSIS — M8580 Other specified disorders of bone density and structure, unspecified site: Secondary | ICD-10-CM | POA: Diagnosis not present

## 2016-07-25 DIAGNOSIS — M79602 Pain in left arm: Secondary | ICD-10-CM | POA: Diagnosis not present

## 2016-07-25 DIAGNOSIS — Z1379 Encounter for other screening for genetic and chromosomal anomalies: Secondary | ICD-10-CM | POA: Diagnosis not present

## 2016-07-25 DIAGNOSIS — I252 Old myocardial infarction: Secondary | ICD-10-CM | POA: Diagnosis not present

## 2016-07-25 DIAGNOSIS — E559 Vitamin D deficiency, unspecified: Secondary | ICD-10-CM | POA: Diagnosis not present

## 2016-07-25 DIAGNOSIS — Z853 Personal history of malignant neoplasm of breast: Secondary | ICD-10-CM | POA: Diagnosis not present

## 2016-07-25 DIAGNOSIS — Z9221 Personal history of antineoplastic chemotherapy: Secondary | ICD-10-CM | POA: Diagnosis not present

## 2016-07-25 DIAGNOSIS — I10 Essential (primary) hypertension: Secondary | ICD-10-CM | POA: Diagnosis not present

## 2016-07-25 DIAGNOSIS — C50212 Malignant neoplasm of upper-inner quadrant of left female breast: Secondary | ICD-10-CM | POA: Diagnosis not present

## 2016-07-25 DIAGNOSIS — N63 Unspecified lump in unspecified breast: Secondary | ICD-10-CM | POA: Diagnosis not present

## 2016-07-25 DIAGNOSIS — R202 Paresthesia of skin: Secondary | ICD-10-CM | POA: Diagnosis not present

## 2016-07-25 DIAGNOSIS — Z08 Encounter for follow-up examination after completed treatment for malignant neoplasm: Secondary | ICD-10-CM | POA: Diagnosis not present

## 2016-07-25 DIAGNOSIS — Z170 Estrogen receptor positive status [ER+]: Secondary | ICD-10-CM | POA: Diagnosis not present

## 2016-07-25 DIAGNOSIS — Z9189 Other specified personal risk factors, not elsewhere classified: Secondary | ICD-10-CM | POA: Diagnosis not present

## 2016-07-25 DIAGNOSIS — Z9889 Other specified postprocedural states: Secondary | ICD-10-CM | POA: Diagnosis not present

## 2016-07-25 DIAGNOSIS — R200 Anesthesia of skin: Secondary | ICD-10-CM | POA: Diagnosis not present

## 2016-08-15 DIAGNOSIS — Z170 Estrogen receptor positive status [ER+]: Secondary | ICD-10-CM | POA: Diagnosis not present

## 2016-08-15 DIAGNOSIS — C50212 Malignant neoplasm of upper-inner quadrant of left female breast: Secondary | ICD-10-CM | POA: Diagnosis not present

## 2016-08-15 DIAGNOSIS — R922 Inconclusive mammogram: Secondary | ICD-10-CM | POA: Diagnosis not present

## 2016-08-15 DIAGNOSIS — N6314 Unspecified lump in the right breast, lower inner quadrant: Secondary | ICD-10-CM | POA: Diagnosis not present

## 2016-09-04 DIAGNOSIS — K210 Gastro-esophageal reflux disease with esophagitis: Secondary | ICD-10-CM | POA: Diagnosis not present

## 2016-09-04 DIAGNOSIS — E785 Hyperlipidemia, unspecified: Secondary | ICD-10-CM | POA: Diagnosis not present

## 2016-09-04 DIAGNOSIS — I214 Non-ST elevation (NSTEMI) myocardial infarction: Secondary | ICD-10-CM | POA: Diagnosis not present

## 2016-09-04 DIAGNOSIS — I10 Essential (primary) hypertension: Secondary | ICD-10-CM | POA: Diagnosis not present

## 2016-11-12 DIAGNOSIS — I10 Essential (primary) hypertension: Secondary | ICD-10-CM | POA: Diagnosis not present

## 2016-11-12 DIAGNOSIS — G459 Transient cerebral ischemic attack, unspecified: Secondary | ICD-10-CM | POA: Diagnosis not present

## 2016-11-12 DIAGNOSIS — I2510 Atherosclerotic heart disease of native coronary artery without angina pectoris: Secondary | ICD-10-CM | POA: Diagnosis not present

## 2016-11-12 DIAGNOSIS — I4510 Unspecified right bundle-branch block: Secondary | ICD-10-CM | POA: Diagnosis not present

## 2016-11-28 DIAGNOSIS — R079 Chest pain, unspecified: Secondary | ICD-10-CM | POA: Diagnosis not present

## 2016-11-28 DIAGNOSIS — I2510 Atherosclerotic heart disease of native coronary artery without angina pectoris: Secondary | ICD-10-CM | POA: Diagnosis not present

## 2016-11-28 DIAGNOSIS — I10 Essential (primary) hypertension: Secondary | ICD-10-CM | POA: Diagnosis not present

## 2017-01-16 DIAGNOSIS — Z23 Encounter for immunization: Secondary | ICD-10-CM | POA: Diagnosis not present

## 2017-02-18 DIAGNOSIS — I10 Essential (primary) hypertension: Secondary | ICD-10-CM | POA: Diagnosis not present

## 2017-02-18 DIAGNOSIS — I2510 Atherosclerotic heart disease of native coronary artery without angina pectoris: Secondary | ICD-10-CM | POA: Diagnosis not present

## 2017-02-19 DIAGNOSIS — R928 Other abnormal and inconclusive findings on diagnostic imaging of breast: Secondary | ICD-10-CM | POA: Diagnosis not present

## 2017-02-19 DIAGNOSIS — Z170 Estrogen receptor positive status [ER+]: Secondary | ICD-10-CM | POA: Diagnosis not present

## 2017-02-19 DIAGNOSIS — N6489 Other specified disorders of breast: Secondary | ICD-10-CM | POA: Diagnosis not present

## 2017-02-19 DIAGNOSIS — N6313 Unspecified lump in the right breast, lower outer quadrant: Secondary | ICD-10-CM | POA: Diagnosis not present

## 2017-02-19 DIAGNOSIS — C50212 Malignant neoplasm of upper-inner quadrant of left female breast: Secondary | ICD-10-CM | POA: Diagnosis not present

## 2017-02-25 DIAGNOSIS — I10 Essential (primary) hypertension: Secondary | ICD-10-CM | POA: Diagnosis not present

## 2017-02-25 DIAGNOSIS — M899 Disorder of bone, unspecified: Secondary | ICD-10-CM | POA: Diagnosis not present

## 2017-02-25 DIAGNOSIS — E785 Hyperlipidemia, unspecified: Secondary | ICD-10-CM | POA: Diagnosis not present

## 2017-02-25 DIAGNOSIS — E559 Vitamin D deficiency, unspecified: Secondary | ICD-10-CM | POA: Diagnosis not present

## 2017-02-25 DIAGNOSIS — Z0000 Encounter for general adult medical examination without abnormal findings: Secondary | ICD-10-CM | POA: Diagnosis not present

## 2017-02-25 DIAGNOSIS — Z1289 Encounter for screening for malignant neoplasm of other sites: Secondary | ICD-10-CM | POA: Diagnosis not present

## 2017-02-25 DIAGNOSIS — K219 Gastro-esophageal reflux disease without esophagitis: Secondary | ICD-10-CM | POA: Diagnosis not present

## 2017-02-25 DIAGNOSIS — M8580 Other specified disorders of bone density and structure, unspecified site: Secondary | ICD-10-CM | POA: Diagnosis not present

## 2017-03-07 DIAGNOSIS — J9801 Acute bronchospasm: Secondary | ICD-10-CM | POA: Diagnosis not present

## 2017-03-07 DIAGNOSIS — J069 Acute upper respiratory infection, unspecified: Secondary | ICD-10-CM | POA: Diagnosis not present

## 2017-03-12 ENCOUNTER — Emergency Department (INDEPENDENT_AMBULATORY_CARE_PROVIDER_SITE_OTHER)
Admission: EM | Admit: 2017-03-12 | Discharge: 2017-03-12 | Disposition: A | Discharge status: Home / Self Care | Payer: Medicare Other | Source: Home / Self Care | Attending: Family Medicine | Admitting: Family Medicine

## 2017-03-12 ENCOUNTER — Other Ambulatory Visit: Payer: Self-pay

## 2017-03-12 ENCOUNTER — Emergency Department (INDEPENDENT_AMBULATORY_CARE_PROVIDER_SITE_OTHER): Payer: Medicare Other

## 2017-03-12 DIAGNOSIS — R0602 Shortness of breath: Secondary | ICD-10-CM

## 2017-03-12 DIAGNOSIS — R05 Cough: Secondary | ICD-10-CM

## 2017-03-12 DIAGNOSIS — R062 Wheezing: Secondary | ICD-10-CM | POA: Diagnosis not present

## 2017-03-12 DIAGNOSIS — R059 Cough, unspecified: Secondary | ICD-10-CM

## 2017-03-12 HISTORY — DX: Hypoglycemia, unspecified: E16.2

## 2017-03-12 HISTORY — DX: Acute myocardial infarction, unspecified: I21.9

## 2017-03-12 MED ORDER — BENZONATATE 100 MG PO CAPS: 100.0000 mg | ORAL_CAPSULE | Freq: Three times a day (TID) | ORAL | 0 refills | Status: AC

## 2017-03-12 MED ORDER — PREDNISONE 5 MG PO TABS: ORAL_TABLET | ORAL | 0 refills | Status: DC

## 2017-03-12 NOTE — Discharge Instructions (Signed)
°  You can continue to take the doxycycline as prescribed. You can use the every 4-6 hours only as needed. Do not use more frequent than every 4 hours. If you need to use it more frequently due to difficulty breathing, please call 911 or go to closest emergency department.  If you are Not having shortness of breath, chest tightness, or wheeze, you do not need to use this medication.

## 2017-03-12 NOTE — ED Triage Notes (Signed)
Congestion started 2 weeks ago.  Fever, on Dec 13 put on doxy and ventolin inhaler.  Was outside yesterday, and felt like it was more difficult to breath last night.  Having upper back pain and pain below the sternum when deep breathing.  Pt stated that she is wheezing when breathing out.

## 2017-03-12 NOTE — ED Provider Notes (Signed)
[facility]    CSN: [id number] Arrival date & time: 03/12/17  1810     History   Chief Complaint Chief Complaint  Patient presents with  . Cough  . Shortness of Breath    last night    HPI Kaitlyn Burke is a 83 y.o. female.   HPI  Kaitlyn Burke is a 83 y.o. female presenting to UC with c/o increased difficulty breathing last night after being outside all day. She was seen by her PCP on December 13th for URI symptoms including cough and congestion that started 2 weeks ago. She was started on Doxycycline and a Ventolin inhaler. She was advised to use her inhaler 4 times a day.  She has associated chest pain and upper back pain with deep inspiration.  She has taken Delsym with mild relief. Denies fever, chills, n/v/d.  No hx of asthma or COPD but has had bronchitis and pneumonia in the past. She has been on inhalers and prednisone in the past when she had bronchitis. She has not been on prednisone for most recent cough. She is followed by cardiology. Last visit was in August of this year. Visit went well per pt.    Past Medical History:  Diagnosis Date  . Cancer ([place])    breast ca  . Heart attack ([place])   . Hypertension   . Hypoglycemia     Patient Active Problem List   Diagnosis Date Noted  . NSTEMI, initial episode of care ([place]) 04/24/2011  . HTN (hypertension) 04/24/2011    Past Surgical History:  Procedure Laterality Date  . BREAST SURGERY     lumpectomy  . TONSILLECTOMY      OB History    No data available       Home Medications    Prior to Admission medications   Medication Sig Start Date End Date Taking? Authorizing Provider  amLODipine (NORVASC) 5 MG tablet Take 5 mg by mouth daily.   Yes [provider]  atenolol (TENORMIN) 25 MG tablet Take by mouth daily.   Yes [provider]  magnesium oxide (MAG-OX) 400 MG tablet Take 400 mg by mouth daily.   Yes [provider]  pravastatin (PRAVACHOL) 40 MG tablet Take 40 mg  by mouth daily.   Yes [provider]  aspirin EC 81 MG tablet Take 81 mg by mouth daily.    [provider]  benzonatate (TESSALON) 100 MG capsule Take 1-2 capsules (100-200 mg total) by mouth every 8 (eight) hours. 03/12/17   [name], PA-C  cholecalciferol (VITAMIN D) 1000 UNITS tablet Take 1,000 Units by mouth daily.    [provider]  losartan-hydrochlorothiazide (HYZAAR) 100-12.5 MG per tablet Take 1 tablet by mouth daily.    [provider]  metoprolol succinate (TOPROL-XL) 50 MG 24 hr tablet Take 75 mg by mouth daily. Take with or immediately following a meal.She takes 1 and 1/2 tablet to equal 75 mg.    [provider]  potassium chloride (K-DUR) 10 MEQ tablet Take 20 mEq by mouth daily.    [provider]  predniSONE (DELTASONE) 5 MG tablet Take 2 tabs for 3 days, take 1 tab for 2 days 03/12/17   [name], PA-C  tamoxifen (NOLVADEX) 10 MG tablet Take 10 mg by mouth 2 (two) times daily.    [provider]    Family History Family History  Problem Relation Age of Onset  . Stroke Mother   . Stroke Father  Social History Social History   Tobacco Use  . Smoking status: Never Smoker  . Smokeless tobacco: Never Used  Substance Use Topics  . Alcohol use: No  . Drug use: No     Allergies   Ampicillin; Penicillins; and Sulfa drugs cross reactors   Review of Systems Review of Systems  Constitutional: Negative for chills and fever.  HENT: Positive for congestion. Negative for ear pain, sore throat, trouble swallowing and voice change.   Respiratory: Positive for cough, shortness of breath and wheezing.   Cardiovascular: Positive for chest pain (with cough or deep inspiration  ). Negative for palpitations.  Gastrointestinal: Negative for abdominal pain, diarrhea, nausea and vomiting.  Musculoskeletal: Negative for arthralgias, back pain and myalgias.  Skin: Negative for rash.     Physical  Exam Triage Vital Signs ED Triage Vitals  Enc Vitals Group     BP 03/12/17 1907 138/84     Pulse Rate 03/12/17 1907 85     Resp --      Temp 03/12/17 1907 98.2 F (36.8 C)     Temp Source 03/12/17 1907 Oral     SpO2 03/12/17 1907 97 %     Weight 03/12/17 1912 154 lb (69.9 kg)     Height 03/12/17 1912 5\' 1"  (1.549 m)     Head Circumference --      Peak Flow --      Pain Score 03/12/17 1912 0     Pain Loc --      Pain Edu? --      Excl. in [place]? --    No data found.  Updated Vital Signs BP 138/84 (BP Location: Right Arm)   Pulse 85   Temp 98.2 F (36.8 C) (Oral)   Ht 5\' 1"  (1.549 m)   Wt 154 lb (69.9 kg)   SpO2 97%   BMI 29.10 kg/m   Visual Acuity Right Eye Distance:   Left Eye Distance:   Bilateral Distance:    Right Eye Near:   Left Eye Near:    Bilateral Near:     Physical Exam  Constitutional: She is oriented to person, place, and time. She appears well-developed and well-nourished.  Non-toxic appearance. She does not appear ill. No distress.  HENT:  Head: Normocephalic and atraumatic.  Right Ear: Tympanic membrane normal.  Left Ear: Tympanic membrane normal.  Nose: Nose normal.  Mouth/Throat: Uvula is midline, oropharynx is clear and moist and mucous membranes are normal.  Eyes: EOM are normal.  Neck: Normal range of motion.  Cardiovascular: Normal rate and regular rhythm.  No murmur heard. Pulmonary/Chest: Effort normal and breath sounds normal. No accessory muscle usage or stridor. No tachypnea and no bradypnea. No respiratory distress. She has no decreased breath sounds. She has no wheezes. She has no rhonchi. She has no rales.  Dry cough during deep inspiration  Musculoskeletal: Normal range of motion.  Neurological: She is alert and oriented to person, place, and time.  Skin: Skin is warm and dry.  Psychiatric: She has a normal mood and affect. Her behavior is normal.  Nursing note and vitals reviewed.    UC Treatments / Results  Labs (all labs  ordered are listed, but only abnormal results are displayed) Labs Reviewed - No data to display  EKG  EKG Interpretation EKG: Sinus rhythm with premature supraventricular complexes. Right bundle branch block. Inferior infarct, age undetermined. Abnormal EKG. No significant change from EKG performed on 11/12/16       Radiology  Dg Chest 2 View  Result Date: 03/12/2017 CLINICAL DATA:  Wheeze and cough for 2 weeks EXAM: CHEST  2 VIEW COMPARISON:  04/24/2011 FINDINGS: Stable borderline heart size. Left breast lumpectomy clips. There is no edema, consolidation, effusion, or pneumothorax. Cholecystectomy clips. IMPRESSION: No evidence of active disease. Electronically Signed   By: Monte Fantasia M.D.   On: 03/12/2017 19:14    Procedures Procedures (including critical care time)  Medications Ordered in UC Medications - No data to display   Initial Impression / Assessment and Plan / UC Course  I have reviewed the triage vital signs and the nursing notes.  Pertinent labs & imaging results that were available during my care of the patient were reviewed by me and considered in my medical decision making (see chart for details).     Chest soreness, cough and SOB likely due to irritation from most recent URI  Doubt ACS or PE Encouraged to keep taking doxycycline as prescribed  she does not need to take her ventolin 4 times daily unless needed Will try prednisone as pt has had previously for similar symptoms F/u with PCP this week  Discussed symptoms that warrant emergent care in the ED.   Final Clinical Impressions(s) / UC Diagnoses   Final diagnoses:  Cough  Shortness of breath    ED Discharge Orders        Ordered    benzonatate (TESSALON) 100 MG capsule  Every 8 hours     03/12/17 1942    predniSONE (DELTASONE) 5 MG tablet     03/12/17 1942       Controlled Substance Prescriptions [place] Controlled Substance Registry consulted? Not Applicable   Tyrell Antonio 03/12/17 2002

## 2017-03-13 ENCOUNTER — Telehealth: Payer: Self-pay | Admitting: *Deleted

## 2017-03-13 NOTE — Telephone Encounter (Signed)
Spoke to pt she reports that she is feeling some better this morning. She has not picked up her medicine yet. Advised her to take meds as prescribed and to call back if she has any questions or concerns. Pt verbalized understanding.

## 2017-04-01 DIAGNOSIS — M85852 Other specified disorders of bone density and structure, left thigh: Secondary | ICD-10-CM | POA: Diagnosis not present

## 2017-04-01 DIAGNOSIS — Z780 Asymptomatic menopausal state: Secondary | ICD-10-CM | POA: Diagnosis not present

## 2017-04-01 DIAGNOSIS — M8588 Other specified disorders of bone density and structure, other site: Secondary | ICD-10-CM | POA: Diagnosis not present

## 2017-05-07 DIAGNOSIS — H2512 Age-related nuclear cataract, left eye: Secondary | ICD-10-CM | POA: Diagnosis not present

## 2017-05-07 DIAGNOSIS — H04123 Dry eye syndrome of bilateral lacrimal glands: Secondary | ICD-10-CM | POA: Diagnosis not present

## 2017-05-07 DIAGNOSIS — H35033 Hypertensive retinopathy, bilateral: Secondary | ICD-10-CM | POA: Diagnosis not present

## 2017-05-07 DIAGNOSIS — Z961 Presence of intraocular lens: Secondary | ICD-10-CM | POA: Diagnosis not present

## 2017-07-18 DIAGNOSIS — R1310 Dysphagia, unspecified: Secondary | ICD-10-CM | POA: Diagnosis not present

## 2017-07-18 DIAGNOSIS — K219 Gastro-esophageal reflux disease without esophagitis: Secondary | ICD-10-CM | POA: Diagnosis not present

## 2017-08-22 DIAGNOSIS — R922 Inconclusive mammogram: Secondary | ICD-10-CM | POA: Diagnosis not present

## 2017-08-22 DIAGNOSIS — C50212 Malignant neoplasm of upper-inner quadrant of left female breast: Secondary | ICD-10-CM | POA: Diagnosis not present

## 2017-08-22 DIAGNOSIS — R928 Other abnormal and inconclusive findings on diagnostic imaging of breast: Secondary | ICD-10-CM | POA: Diagnosis not present

## 2017-08-22 DIAGNOSIS — Z170 Estrogen receptor positive status [ER+]: Secondary | ICD-10-CM | POA: Diagnosis not present

## 2017-08-28 DIAGNOSIS — I2510 Atherosclerotic heart disease of native coronary artery without angina pectoris: Secondary | ICD-10-CM | POA: Diagnosis not present

## 2017-08-28 DIAGNOSIS — I4510 Unspecified right bundle-branch block: Secondary | ICD-10-CM | POA: Diagnosis not present

## 2017-08-28 DIAGNOSIS — I491 Atrial premature depolarization: Secondary | ICD-10-CM | POA: Diagnosis not present

## 2017-08-28 DIAGNOSIS — R079 Chest pain, unspecified: Secondary | ICD-10-CM | POA: Diagnosis not present

## 2017-09-02 DIAGNOSIS — I10 Essential (primary) hypertension: Secondary | ICD-10-CM | POA: Diagnosis not present

## 2017-09-02 DIAGNOSIS — M8580 Other specified disorders of bone density and structure, unspecified site: Secondary | ICD-10-CM | POA: Diagnosis not present

## 2017-09-02 DIAGNOSIS — E785 Hyperlipidemia, unspecified: Secondary | ICD-10-CM | POA: Diagnosis not present

## 2017-09-02 DIAGNOSIS — L309 Dermatitis, unspecified: Secondary | ICD-10-CM | POA: Diagnosis not present

## 2017-09-11 DIAGNOSIS — I2510 Atherosclerotic heart disease of native coronary artery without angina pectoris: Secondary | ICD-10-CM | POA: Diagnosis not present

## 2017-09-11 DIAGNOSIS — I272 Pulmonary hypertension, unspecified: Secondary | ICD-10-CM | POA: Diagnosis not present

## 2017-09-11 DIAGNOSIS — I361 Nonrheumatic tricuspid (valve) insufficiency: Secondary | ICD-10-CM | POA: Diagnosis not present

## 2017-09-11 DIAGNOSIS — R079 Chest pain, unspecified: Secondary | ICD-10-CM | POA: Diagnosis not present

## 2017-09-16 DIAGNOSIS — I10 Essential (primary) hypertension: Secondary | ICD-10-CM | POA: Diagnosis not present

## 2017-09-16 DIAGNOSIS — Z9889 Other specified postprocedural states: Secondary | ICD-10-CM | POA: Diagnosis not present

## 2017-09-16 DIAGNOSIS — Z853 Personal history of malignant neoplasm of breast: Secondary | ICD-10-CM | POA: Diagnosis not present

## 2017-09-16 DIAGNOSIS — C50212 Malignant neoplasm of upper-inner quadrant of left female breast: Secondary | ICD-10-CM | POA: Diagnosis not present

## 2017-09-16 DIAGNOSIS — Z9189 Other specified personal risk factors, not elsewhere classified: Secondary | ICD-10-CM | POA: Diagnosis not present

## 2017-09-16 DIAGNOSIS — Z923 Personal history of irradiation: Secondary | ICD-10-CM | POA: Diagnosis not present

## 2017-09-16 DIAGNOSIS — Z170 Estrogen receptor positive status [ER+]: Secondary | ICD-10-CM | POA: Diagnosis not present

## 2017-09-16 DIAGNOSIS — E559 Vitamin D deficiency, unspecified: Secondary | ICD-10-CM | POA: Diagnosis not present

## 2017-09-16 DIAGNOSIS — Z803 Family history of malignant neoplasm of breast: Secondary | ICD-10-CM | POA: Diagnosis not present

## 2017-09-16 DIAGNOSIS — I252 Old myocardial infarction: Secondary | ICD-10-CM | POA: Diagnosis not present

## 2017-09-16 DIAGNOSIS — Z1379 Encounter for other screening for genetic and chromosomal anomalies: Secondary | ICD-10-CM | POA: Diagnosis not present

## 2017-09-19 DIAGNOSIS — I6602 Occlusion and stenosis of left middle cerebral artery: Secondary | ICD-10-CM | POA: Diagnosis not present

## 2017-09-19 DIAGNOSIS — I25119 Atherosclerotic heart disease of native coronary artery with unspecified angina pectoris: Secondary | ICD-10-CM | POA: Diagnosis not present

## 2017-09-19 DIAGNOSIS — I209 Angina pectoris, unspecified: Secondary | ICD-10-CM | POA: Diagnosis not present

## 2017-09-19 DIAGNOSIS — I639 Cerebral infarction, unspecified: Secondary | ICD-10-CM | POA: Diagnosis not present

## 2017-09-19 DIAGNOSIS — R4701 Aphasia: Secondary | ICD-10-CM | POA: Diagnosis not present

## 2017-09-19 DIAGNOSIS — I771 Stricture of artery: Secondary | ICD-10-CM | POA: Diagnosis not present

## 2017-09-19 DIAGNOSIS — I10 Essential (primary) hypertension: Secondary | ICD-10-CM | POA: Diagnosis not present

## 2017-09-19 DIAGNOSIS — I63512 Cerebral infarction due to unspecified occlusion or stenosis of left middle cerebral artery: Secondary | ICD-10-CM | POA: Diagnosis not present

## 2017-09-19 DIAGNOSIS — K219 Gastro-esophageal reflux disease without esophagitis: Secondary | ICD-10-CM | POA: Diagnosis not present

## 2017-09-19 DIAGNOSIS — G8191 Hemiplegia, unspecified affecting right dominant side: Secondary | ICD-10-CM | POA: Diagnosis not present

## 2017-09-19 DIAGNOSIS — I6350 Cerebral infarction due to unspecified occlusion or stenosis of unspecified cerebral artery: Secondary | ICD-10-CM | POA: Diagnosis not present

## 2017-09-22 DIAGNOSIS — I6350 Cerebral infarction due to unspecified occlusion or stenosis of unspecified cerebral artery: Secondary | ICD-10-CM | POA: Diagnosis not present

## 2017-09-22 DIAGNOSIS — R001 Bradycardia, unspecified: Secondary | ICD-10-CM | POA: Diagnosis present

## 2017-09-22 DIAGNOSIS — I4510 Unspecified right bundle-branch block: Secondary | ICD-10-CM | POA: Diagnosis not present

## 2017-09-22 DIAGNOSIS — K219 Gastro-esophageal reflux disease without esophagitis: Secondary | ICD-10-CM | POA: Diagnosis present

## 2017-09-22 DIAGNOSIS — Z9012 Acquired absence of left breast and nipple: Secondary | ICD-10-CM | POA: Diagnosis not present

## 2017-09-22 DIAGNOSIS — Z881 Allergy status to other antibiotic agents status: Secondary | ICD-10-CM | POA: Diagnosis not present

## 2017-09-22 DIAGNOSIS — E785 Hyperlipidemia, unspecified: Secondary | ICD-10-CM | POA: Diagnosis present

## 2017-09-22 DIAGNOSIS — Z960 Presence of urogenital implants: Secondary | ICD-10-CM | POA: Diagnosis not present

## 2017-09-22 DIAGNOSIS — Z9010 Acquired absence of unspecified breast and nipple: Secondary | ICD-10-CM | POA: Diagnosis not present

## 2017-09-22 DIAGNOSIS — Z7902 Long term (current) use of antithrombotics/antiplatelets: Secondary | ICD-10-CM | POA: Diagnosis not present

## 2017-09-22 DIAGNOSIS — Z9282 Status post administration of tPA (rtPA) in a different facility within the last 24 hours prior to admission to current facility: Secondary | ICD-10-CM | POA: Diagnosis not present

## 2017-09-22 DIAGNOSIS — I63512 Cerebral infarction due to unspecified occlusion or stenosis of left middle cerebral artery: Secondary | ICD-10-CM | POA: Diagnosis present

## 2017-09-22 DIAGNOSIS — I6389 Other cerebral infarction: Secondary | ICD-10-CM | POA: Diagnosis not present

## 2017-09-22 DIAGNOSIS — Z853 Personal history of malignant neoplasm of breast: Secondary | ICD-10-CM | POA: Diagnosis not present

## 2017-09-22 DIAGNOSIS — I2510 Atherosclerotic heart disease of native coronary artery without angina pectoris: Secondary | ICD-10-CM | POA: Diagnosis not present

## 2017-09-22 DIAGNOSIS — Z79899 Other long term (current) drug therapy: Secondary | ICD-10-CM | POA: Diagnosis not present

## 2017-09-22 DIAGNOSIS — Z9861 Coronary angioplasty status: Secondary | ICD-10-CM | POA: Diagnosis not present

## 2017-09-22 DIAGNOSIS — R531 Weakness: Secondary | ICD-10-CM | POA: Diagnosis not present

## 2017-09-22 DIAGNOSIS — I639 Cerebral infarction, unspecified: Secondary | ICD-10-CM | POA: Diagnosis not present

## 2017-09-22 DIAGNOSIS — Z880 Allergy status to penicillin: Secondary | ICD-10-CM | POA: Diagnosis not present

## 2017-09-22 DIAGNOSIS — G8191 Hemiplegia, unspecified affecting right dominant side: Secondary | ICD-10-CM | POA: Diagnosis present

## 2017-09-22 DIAGNOSIS — S60211A Contusion of right wrist, initial encounter: Secondary | ICD-10-CM | POA: Diagnosis present

## 2017-09-22 DIAGNOSIS — I209 Angina pectoris, unspecified: Secondary | ICD-10-CM | POA: Diagnosis not present

## 2017-09-22 DIAGNOSIS — I10 Essential (primary) hypertension: Secondary | ICD-10-CM | POA: Diagnosis not present

## 2017-09-22 DIAGNOSIS — I25119 Atherosclerotic heart disease of native coronary artery with unspecified angina pectoris: Secondary | ICD-10-CM | POA: Diagnosis present

## 2017-09-22 DIAGNOSIS — R29702 NIHSS score 2: Secondary | ICD-10-CM | POA: Diagnosis present

## 2017-09-22 DIAGNOSIS — Z882 Allergy status to sulfonamides status: Secondary | ICD-10-CM | POA: Diagnosis not present

## 2017-09-22 DIAGNOSIS — Z7982 Long term (current) use of aspirin: Secondary | ICD-10-CM | POA: Diagnosis not present

## 2017-09-22 DIAGNOSIS — I6602 Occlusion and stenosis of left middle cerebral artery: Secondary | ICD-10-CM | POA: Diagnosis not present

## 2017-09-22 DIAGNOSIS — I252 Old myocardial infarction: Secondary | ICD-10-CM | POA: Diagnosis not present

## 2017-09-22 DIAGNOSIS — R4781 Slurred speech: Secondary | ICD-10-CM | POA: Diagnosis not present

## 2017-09-22 DIAGNOSIS — M25531 Pain in right wrist: Secondary | ICD-10-CM | POA: Diagnosis not present

## 2017-09-22 DIAGNOSIS — Z923 Personal history of irradiation: Secondary | ICD-10-CM | POA: Diagnosis not present

## 2017-09-22 DIAGNOSIS — Z91018 Allergy to other foods: Secondary | ICD-10-CM | POA: Diagnosis not present

## 2017-09-22 DIAGNOSIS — R4701 Aphasia: Secondary | ICD-10-CM | POA: Diagnosis not present

## 2017-09-22 DIAGNOSIS — Z8679 Personal history of other diseases of the circulatory system: Secondary | ICD-10-CM | POA: Diagnosis not present

## 2017-09-22 DIAGNOSIS — Z95828 Presence of other vascular implants and grafts: Secondary | ICD-10-CM | POA: Diagnosis not present

## 2017-09-22 DIAGNOSIS — Z9889 Other specified postprocedural states: Secondary | ICD-10-CM | POA: Diagnosis not present

## 2017-09-25 DIAGNOSIS — R58 Hemorrhage, not elsewhere classified: Secondary | ICD-10-CM | POA: Diagnosis not present

## 2017-09-25 DIAGNOSIS — M7989 Other specified soft tissue disorders: Secondary | ICD-10-CM | POA: Diagnosis not present

## 2017-10-21 DIAGNOSIS — R51 Headache: Secondary | ICD-10-CM | POA: Diagnosis not present

## 2017-10-21 DIAGNOSIS — I252 Old myocardial infarction: Secondary | ICD-10-CM | POA: Diagnosis not present

## 2017-10-21 DIAGNOSIS — Z7982 Long term (current) use of aspirin: Secondary | ICD-10-CM | POA: Diagnosis not present

## 2017-10-21 DIAGNOSIS — G6289 Other specified polyneuropathies: Secondary | ICD-10-CM | POA: Diagnosis not present

## 2017-10-21 DIAGNOSIS — Z8673 Personal history of transient ischemic attack (TIA), and cerebral infarction without residual deficits: Secondary | ICD-10-CM | POA: Diagnosis not present

## 2017-10-21 DIAGNOSIS — I10 Essential (primary) hypertension: Secondary | ICD-10-CM | POA: Diagnosis not present

## 2017-10-21 DIAGNOSIS — Z79899 Other long term (current) drug therapy: Secondary | ICD-10-CM | POA: Diagnosis not present

## 2017-10-21 DIAGNOSIS — Z923 Personal history of irradiation: Secondary | ICD-10-CM | POA: Diagnosis not present

## 2017-10-21 DIAGNOSIS — I639 Cerebral infarction, unspecified: Secondary | ICD-10-CM | POA: Diagnosis not present

## 2017-10-21 DIAGNOSIS — Z9889 Other specified postprocedural states: Secondary | ICD-10-CM | POA: Diagnosis not present

## 2017-10-21 DIAGNOSIS — I2510 Atherosclerotic heart disease of native coronary artery without angina pectoris: Secondary | ICD-10-CM | POA: Diagnosis not present

## 2017-10-21 DIAGNOSIS — I63512 Cerebral infarction due to unspecified occlusion or stenosis of left middle cerebral artery: Secondary | ICD-10-CM | POA: Diagnosis not present

## 2017-10-21 DIAGNOSIS — Z853 Personal history of malignant neoplasm of breast: Secondary | ICD-10-CM | POA: Diagnosis not present

## 2017-10-21 DIAGNOSIS — Z7902 Long term (current) use of antithrombotics/antiplatelets: Secondary | ICD-10-CM | POA: Diagnosis not present

## 2017-10-21 DIAGNOSIS — I6602 Occlusion and stenosis of left middle cerebral artery: Secondary | ICD-10-CM | POA: Diagnosis not present

## 2017-11-11 DIAGNOSIS — Z7982 Long term (current) use of aspirin: Secondary | ICD-10-CM | POA: Diagnosis not present

## 2017-11-11 DIAGNOSIS — Z7902 Long term (current) use of antithrombotics/antiplatelets: Secondary | ICD-10-CM | POA: Diagnosis not present

## 2017-11-11 DIAGNOSIS — I2510 Atherosclerotic heart disease of native coronary artery without angina pectoris: Secondary | ICD-10-CM | POA: Diagnosis not present

## 2017-11-11 DIAGNOSIS — Z09 Encounter for follow-up examination after completed treatment for conditions other than malignant neoplasm: Secondary | ICD-10-CM | POA: Diagnosis not present

## 2017-11-11 DIAGNOSIS — Z9889 Other specified postprocedural states: Secondary | ICD-10-CM | POA: Diagnosis not present

## 2017-11-11 DIAGNOSIS — I63512 Cerebral infarction due to unspecified occlusion or stenosis of left middle cerebral artery: Secondary | ICD-10-CM | POA: Diagnosis not present

## 2017-11-11 DIAGNOSIS — I252 Old myocardial infarction: Secondary | ICD-10-CM | POA: Diagnosis not present

## 2017-11-11 DIAGNOSIS — I10 Essential (primary) hypertension: Secondary | ICD-10-CM | POA: Diagnosis not present

## 2017-11-11 DIAGNOSIS — Z853 Personal history of malignant neoplasm of breast: Secondary | ICD-10-CM | POA: Diagnosis not present

## 2017-11-11 DIAGNOSIS — Z923 Personal history of irradiation: Secondary | ICD-10-CM | POA: Diagnosis not present

## 2017-11-11 DIAGNOSIS — Z8673 Personal history of transient ischemic attack (TIA), and cerebral infarction without residual deficits: Secondary | ICD-10-CM | POA: Diagnosis not present

## 2017-12-10 DIAGNOSIS — E785 Hyperlipidemia, unspecified: Secondary | ICD-10-CM | POA: Diagnosis not present

## 2017-12-10 DIAGNOSIS — I10 Essential (primary) hypertension: Secondary | ICD-10-CM | POA: Diagnosis not present

## 2017-12-10 DIAGNOSIS — K219 Gastro-esophageal reflux disease without esophagitis: Secondary | ICD-10-CM | POA: Diagnosis not present

## 2017-12-10 DIAGNOSIS — Z8673 Personal history of transient ischemic attack (TIA), and cerebral infarction without residual deficits: Secondary | ICD-10-CM | POA: Diagnosis not present

## 2018-01-13 DIAGNOSIS — R9431 Abnormal electrocardiogram [ECG] [EKG]: Secondary | ICD-10-CM | POA: Diagnosis not present

## 2018-01-13 DIAGNOSIS — I2510 Atherosclerotic heart disease of native coronary artery without angina pectoris: Secondary | ICD-10-CM | POA: Diagnosis not present

## 2018-01-13 DIAGNOSIS — I63512 Cerebral infarction due to unspecified occlusion or stenosis of left middle cerebral artery: Secondary | ICD-10-CM | POA: Diagnosis not present

## 2018-01-13 DIAGNOSIS — I4510 Unspecified right bundle-branch block: Secondary | ICD-10-CM | POA: Diagnosis not present

## 2018-01-13 DIAGNOSIS — R001 Bradycardia, unspecified: Secondary | ICD-10-CM | POA: Diagnosis not present

## 2018-01-13 DIAGNOSIS — I10 Essential (primary) hypertension: Secondary | ICD-10-CM | POA: Diagnosis not present

## 2018-01-21 DIAGNOSIS — M792 Neuralgia and neuritis, unspecified: Secondary | ICD-10-CM | POA: Diagnosis not present

## 2018-01-21 DIAGNOSIS — Z8673 Personal history of transient ischemic attack (TIA), and cerebral infarction without residual deficits: Secondary | ICD-10-CM | POA: Diagnosis not present

## 2018-01-21 DIAGNOSIS — Z79899 Other long term (current) drug therapy: Secondary | ICD-10-CM | POA: Diagnosis not present

## 2018-01-21 DIAGNOSIS — I6602 Occlusion and stenosis of left middle cerebral artery: Secondary | ICD-10-CM | POA: Diagnosis not present

## 2018-01-21 DIAGNOSIS — Z880 Allergy status to penicillin: Secondary | ICD-10-CM | POA: Diagnosis not present

## 2018-01-21 DIAGNOSIS — Z7982 Long term (current) use of aspirin: Secondary | ICD-10-CM | POA: Diagnosis not present

## 2018-01-21 DIAGNOSIS — Z882 Allergy status to sulfonamides status: Secondary | ICD-10-CM | POA: Diagnosis not present

## 2018-01-21 DIAGNOSIS — I6930 Unspecified sequelae of cerebral infarction: Secondary | ICD-10-CM | POA: Diagnosis not present

## 2018-01-21 DIAGNOSIS — Z886 Allergy status to analgesic agent status: Secondary | ICD-10-CM | POA: Diagnosis not present

## 2018-01-21 DIAGNOSIS — I10 Essential (primary) hypertension: Secondary | ICD-10-CM | POA: Diagnosis not present

## 2018-01-21 DIAGNOSIS — Z7902 Long term (current) use of antithrombotics/antiplatelets: Secondary | ICD-10-CM | POA: Diagnosis not present

## 2018-01-21 DIAGNOSIS — Z09 Encounter for follow-up examination after completed treatment for conditions other than malignant neoplasm: Secondary | ICD-10-CM | POA: Diagnosis not present

## 2018-02-10 ENCOUNTER — Other Ambulatory Visit: Payer: Self-pay

## 2018-02-17 DIAGNOSIS — N6313 Unspecified lump in the right breast, lower outer quadrant: Secondary | ICD-10-CM | POA: Diagnosis not present

## 2018-02-17 DIAGNOSIS — Z9189 Other specified personal risk factors, not elsewhere classified: Secondary | ICD-10-CM | POA: Diagnosis not present

## 2018-03-17 DIAGNOSIS — Z0000 Encounter for general adult medical examination without abnormal findings: Secondary | ICD-10-CM | POA: Diagnosis not present

## 2018-03-17 DIAGNOSIS — M899 Disorder of bone, unspecified: Secondary | ICD-10-CM | POA: Diagnosis not present

## 2018-03-17 DIAGNOSIS — M5030 Other cervical disc degeneration, unspecified cervical region: Secondary | ICD-10-CM | POA: Diagnosis not present

## 2018-03-17 DIAGNOSIS — E559 Vitamin D deficiency, unspecified: Secondary | ICD-10-CM | POA: Diagnosis not present

## 2018-03-17 DIAGNOSIS — Z1231 Encounter for screening mammogram for malignant neoplasm of breast: Secondary | ICD-10-CM | POA: Diagnosis not present

## 2018-03-17 DIAGNOSIS — M8580 Other specified disorders of bone density and structure, unspecified site: Secondary | ICD-10-CM | POA: Diagnosis not present

## 2018-03-17 DIAGNOSIS — R5383 Other fatigue: Secondary | ICD-10-CM | POA: Diagnosis not present

## 2018-03-17 DIAGNOSIS — I10 Essential (primary) hypertension: Secondary | ICD-10-CM | POA: Diagnosis not present

## 2018-03-17 DIAGNOSIS — E785 Hyperlipidemia, unspecified: Secondary | ICD-10-CM | POA: Diagnosis not present

## 2018-05-06 DIAGNOSIS — R748 Abnormal levels of other serum enzymes: Secondary | ICD-10-CM | POA: Diagnosis not present

## 2018-05-06 DIAGNOSIS — Z1211 Encounter for screening for malignant neoplasm of colon: Secondary | ICD-10-CM | POA: Diagnosis not present

## 2018-05-23 DIAGNOSIS — H35372 Puckering of macula, left eye: Secondary | ICD-10-CM | POA: Diagnosis not present

## 2018-05-23 DIAGNOSIS — H35033 Hypertensive retinopathy, bilateral: Secondary | ICD-10-CM | POA: Diagnosis not present

## 2018-05-23 DIAGNOSIS — H43813 Vitreous degeneration, bilateral: Secondary | ICD-10-CM | POA: Diagnosis not present

## 2018-05-23 DIAGNOSIS — H04123 Dry eye syndrome of bilateral lacrimal glands: Secondary | ICD-10-CM | POA: Diagnosis not present

## 2018-08-26 DIAGNOSIS — I639 Cerebral infarction, unspecified: Secondary | ICD-10-CM | POA: Diagnosis not present

## 2018-08-27 DIAGNOSIS — R079 Chest pain, unspecified: Secondary | ICD-10-CM | POA: Diagnosis not present

## 2018-08-27 DIAGNOSIS — I4510 Unspecified right bundle-branch block: Secondary | ICD-10-CM | POA: Diagnosis not present

## 2018-08-27 DIAGNOSIS — R931 Abnormal findings on diagnostic imaging of heart and coronary circulation: Secondary | ICD-10-CM | POA: Diagnosis not present

## 2018-08-27 DIAGNOSIS — R001 Bradycardia, unspecified: Secondary | ICD-10-CM | POA: Diagnosis not present

## 2018-08-27 DIAGNOSIS — I2510 Atherosclerotic heart disease of native coronary artery without angina pectoris: Secondary | ICD-10-CM | POA: Diagnosis not present

## 2018-08-27 DIAGNOSIS — R0789 Other chest pain: Secondary | ICD-10-CM | POA: Diagnosis not present

## 2018-08-27 DIAGNOSIS — I209 Angina pectoris, unspecified: Secondary | ICD-10-CM | POA: Diagnosis not present

## 2018-08-28 DIAGNOSIS — Z853 Personal history of malignant neoplasm of breast: Secondary | ICD-10-CM | POA: Diagnosis not present

## 2018-08-28 DIAGNOSIS — R928 Other abnormal and inconclusive findings on diagnostic imaging of breast: Secondary | ICD-10-CM | POA: Diagnosis not present

## 2018-08-28 DIAGNOSIS — N6311 Unspecified lump in the right breast, upper outer quadrant: Secondary | ICD-10-CM | POA: Diagnosis not present

## 2018-08-28 DIAGNOSIS — Z170 Estrogen receptor positive status [ER+]: Secondary | ICD-10-CM | POA: Diagnosis not present

## 2018-08-28 DIAGNOSIS — N644 Mastodynia: Secondary | ICD-10-CM | POA: Diagnosis not present

## 2018-08-28 DIAGNOSIS — C50212 Malignant neoplasm of upper-inner quadrant of left female breast: Secondary | ICD-10-CM | POA: Diagnosis not present

## 2018-09-17 DIAGNOSIS — R748 Abnormal levels of other serum enzymes: Secondary | ICD-10-CM | POA: Diagnosis not present

## 2018-09-17 DIAGNOSIS — K219 Gastro-esophageal reflux disease without esophagitis: Secondary | ICD-10-CM | POA: Diagnosis not present

## 2018-09-17 DIAGNOSIS — I10 Essential (primary) hypertension: Secondary | ICD-10-CM | POA: Diagnosis not present

## 2018-09-17 DIAGNOSIS — L309 Dermatitis, unspecified: Secondary | ICD-10-CM | POA: Diagnosis not present

## 2018-09-17 DIAGNOSIS — E785 Hyperlipidemia, unspecified: Secondary | ICD-10-CM | POA: Diagnosis not present

## 2018-09-19 DIAGNOSIS — R0789 Other chest pain: Secondary | ICD-10-CM | POA: Diagnosis not present

## 2018-09-19 DIAGNOSIS — E876 Hypokalemia: Secondary | ICD-10-CM | POA: Diagnosis not present

## 2018-09-19 DIAGNOSIS — Z9189 Other specified personal risk factors, not elsewhere classified: Secondary | ICD-10-CM | POA: Diagnosis not present

## 2018-09-19 DIAGNOSIS — Z1379 Encounter for other screening for genetic and chromosomal anomalies: Secondary | ICD-10-CM | POA: Diagnosis not present

## 2018-09-19 DIAGNOSIS — Z1231 Encounter for screening mammogram for malignant neoplasm of breast: Secondary | ICD-10-CM | POA: Diagnosis not present

## 2018-09-19 DIAGNOSIS — R1030 Lower abdominal pain, unspecified: Secondary | ICD-10-CM | POA: Diagnosis not present

## 2018-09-19 DIAGNOSIS — Z1239 Encounter for other screening for malignant neoplasm of breast: Secondary | ICD-10-CM | POA: Diagnosis not present

## 2018-09-19 DIAGNOSIS — K219 Gastro-esophageal reflux disease without esophagitis: Secondary | ICD-10-CM | POA: Diagnosis not present

## 2018-09-19 DIAGNOSIS — I10 Essential (primary) hypertension: Secondary | ICD-10-CM | POA: Diagnosis not present

## 2018-09-19 DIAGNOSIS — Z79899 Other long term (current) drug therapy: Secondary | ICD-10-CM | POA: Diagnosis not present

## 2018-09-19 DIAGNOSIS — E559 Vitamin D deficiency, unspecified: Secondary | ICD-10-CM | POA: Diagnosis not present

## 2018-09-19 DIAGNOSIS — Z170 Estrogen receptor positive status [ER+]: Secondary | ICD-10-CM | POA: Diagnosis not present

## 2018-09-19 DIAGNOSIS — C50212 Malignant neoplasm of upper-inner quadrant of left female breast: Secondary | ICD-10-CM | POA: Diagnosis not present

## 2018-09-30 DIAGNOSIS — I10 Essential (primary) hypertension: Secondary | ICD-10-CM | POA: Diagnosis not present

## 2018-09-30 DIAGNOSIS — R634 Abnormal weight loss: Secondary | ICD-10-CM | POA: Diagnosis not present

## 2018-09-30 DIAGNOSIS — R109 Unspecified abdominal pain: Secondary | ICD-10-CM | POA: Diagnosis not present

## 2018-09-30 DIAGNOSIS — K219 Gastro-esophageal reflux disease without esophagitis: Secondary | ICD-10-CM | POA: Diagnosis not present

## 2018-09-30 DIAGNOSIS — R110 Nausea: Secondary | ICD-10-CM | POA: Diagnosis not present

## 2018-10-06 DIAGNOSIS — R1013 Epigastric pain: Secondary | ICD-10-CM | POA: Diagnosis not present

## 2018-10-06 DIAGNOSIS — R1030 Lower abdominal pain, unspecified: Secondary | ICD-10-CM | POA: Diagnosis not present

## 2018-10-06 DIAGNOSIS — R110 Nausea: Secondary | ICD-10-CM | POA: Diagnosis not present

## 2018-10-06 DIAGNOSIS — R634 Abnormal weight loss: Secondary | ICD-10-CM | POA: Diagnosis not present

## 2018-10-06 DIAGNOSIS — R109 Unspecified abdominal pain: Secondary | ICD-10-CM | POA: Diagnosis not present

## 2018-10-06 DIAGNOSIS — K7689 Other specified diseases of liver: Secondary | ICD-10-CM | POA: Diagnosis not present

## 2018-10-16 DIAGNOSIS — Z01419 Encounter for gynecological examination (general) (routine) without abnormal findings: Secondary | ICD-10-CM | POA: Diagnosis not present

## 2018-10-16 DIAGNOSIS — D3910 Neoplasm of uncertain behavior of unspecified ovary: Secondary | ICD-10-CM | POA: Diagnosis not present

## 2018-10-16 DIAGNOSIS — N83201 Unspecified ovarian cyst, right side: Secondary | ICD-10-CM | POA: Diagnosis not present

## 2018-10-16 DIAGNOSIS — E119 Type 2 diabetes mellitus without complications: Secondary | ICD-10-CM | POA: Diagnosis not present

## 2018-10-16 DIAGNOSIS — R1909 Other intra-abdominal and pelvic swelling, mass and lump: Secondary | ICD-10-CM | POA: Diagnosis not present

## 2018-10-29 ENCOUNTER — Emergency Department (INDEPENDENT_AMBULATORY_CARE_PROVIDER_SITE_OTHER)
Admission: EM | Admit: 2018-10-29 | Discharge: 2018-10-29 | Disposition: A | Discharge status: Home / Self Care | Payer: Medicare Other | Source: Home / Self Care | Attending: Emergency Medicine | Admitting: Emergency Medicine

## 2018-10-29 ENCOUNTER — Other Ambulatory Visit: Payer: Self-pay

## 2018-10-29 DIAGNOSIS — H9201 Otalgia, right ear: Secondary | ICD-10-CM | POA: Diagnosis not present

## 2018-10-29 HISTORY — DX: Cerebral infarction, unspecified: I63.9

## 2018-10-29 NOTE — Discharge Instructions (Addendum)
I know you had a funny feeling in your right ear this morning. I am glad that the feeling in your right ear has gone away and your right ear feels normal and your hearing normal now. On physical exam of right ear: There is no sign of wax blockage.  No sign of infection.  No sign of fluid behind the right ear.  Right eardrum looks normal.   There is no sign of any new stroke. Plans: No particular treatment for your right ear at this point. Please keep the appointment you are made with [facility]NT tomorrow if you have any concerns or right ear symptoms.

## 2018-10-29 NOTE — ED Triage Notes (Signed)
Pt states that her ear feels full, and is having poor hearing in the right ear, and a "screw driver screwing" sound.

## 2018-10-29 NOTE — ED Provider Notes (Signed)
[facility]    CSN: [id number] Arrival date & time: 10/29/18  1017     History   Chief Complaint Chief Complaint  Patient presents with  . Ear Fullness  Chief complaint: Right ear fullness and right ear symptoms.  HPI Kaitlyn Burke is a 85 y.o. female.   HPI Patient states she was in her usual state of health yesterday.  Then last night, felt a little nauseated but tolerated p.o.'s without vomiting or abdominal pain or GI symptoms.  Pt states that she awoke this morning, noted right ear ear felt full, and noticed poor hearing in the right ear, and a funny noise of "screw driver screwing" sound in right ear.  Did not have any right ear pain or drainage.  No history of trauma to right ear. She tried blowing her nose.  Only minimal clear mucus from nose but then right ear symptoms improved a little bit.  Denies fever or chills or discolored rhinorrhea or cough or chest pain.  No syncope or any new neurologic symptoms.  Denies sore throat or dysphasia pain or swelling. Denies vertigo  She states the nausea has since resolved. She came to urgent care today to evaluate the right ear symptoms, but by the time I came into exam room to take history from patient, she states that the right ear symptoms and difficulty hearing have now resolved and are back to her normal baseline. Past Medical History:  Diagnosis Date  . Cancer ([name])    breast ca  . Heart attack ([name])   . Hypertension   . Hypoglycemia   . Stroke [name])     Patient Active Problem List   Diagnosis Date Noted  . NSTEMI, initial episode of care ([name]) 04/24/2011  . HTN (hypertension) 04/24/2011    Past Surgical History:  Procedure Laterality Date  . BREAST SURGERY     lumpectomy  . TONSILLECTOMY      OB History   No obstetric history on file.      Home Medications    Prior to Admission medications   Medication Sig Start Date End Date Taking? Authorizing Provider  atorvastatin (LIPITOR) 20 MG  tablet atorvastatin 20 mg tablet 09/24/17  Yes [provider]  amLODipine (NORVASC) 5 MG tablet Take 5 mg by mouth daily.    [provider]  aspirin EC 81 MG tablet Take 81 mg by mouth daily.    [provider]  atenolol (TENORMIN) 25 MG tablet Take by mouth daily.    [provider]  benzonatate (TESSALON) 100 MG capsule Take 1-2 capsules (100-200 mg total) by mouth every 8 (eight) hours. 03/12/17   [name], PA-C  cholecalciferol (VITAMIN D) 1000 UNITS tablet Take 1,000 Units by mouth daily.    [provider]  losartan-hydrochlorothiazide (HYZAAR) 100-12.5 MG per tablet Take 1 tablet by mouth daily.    [provider]  magnesium oxide (MAG-OX) 400 MG tablet Take 400 mg by mouth daily.    [provider]  metoprolol succinate (TOPROL-XL) 50 MG 24 hr tablet Take 75 mg by mouth daily. Take with or immediately following a meal.She takes 1 and 1/2 tablet to equal 75 mg.    [provider]  pantoprazole (PROTONIX) 20 MG tablet Protonix    [provider]  potassium chloride (K-DUR) 10 MEQ tablet Take 20 mEq by mouth daily.    [provider]  pravastatin (PRAVACHOL) 40 MG tablet Take 40 mg by mouth daily.  [provider]  tamoxifen (NOLVADEX) 10 MG tablet Take 10 mg by mouth 2 (two) times daily.    [provider]    Family History Family History  Problem Relation Age of Onset  . Stroke Mother   . Stroke Father     Social History Social History   Tobacco Use  . Smoking status: Never Smoker  . Smokeless tobacco: Never Used  Substance Use Topics  . Alcohol use: No  . Drug use: No     Allergies   Ampicillin, Penicillins, and Sulfa drugs cross reactors   Review of Systems Review of Systems  Pertinent items noted in HPI and remainder of comprehensive ROS otherwise negative.  Physical Exam Triage Vital Signs ED Triage Vitals  Enc Vitals Group     BP 10/29/18 1109  130/72     Pulse Rate 10/29/18 1109 63     Resp 10/29/18 1109 20     Temp 10/29/18 1109 98 F (36.7 C)     Temp Source 10/29/18 1109 Oral     SpO2 10/29/18 1109 97 %     Weight 10/29/18 1110 154 lb (69.9 kg)     Height 10/29/18 1110 5\' 1"  (1.549 m)     Head Circumference --      Peak Flow --      Pain Score 10/29/18 1110 0     Pain Loc --      Pain Edu? --      Excl. in [name]? --    No data found.  Updated Vital Signs BP 130/72 (BP Location: Right Arm)   Pulse 63   Temp 98 F (36.7 C) (Oral)   Resp 20   Ht 5\' 1"  (1.549 m)   Wt 69.9 kg   SpO2 97%   BMI 29.10 kg/m   Visual Acuity Right Eye Distance:   Left Eye Distance:   Bilateral Distance:    Right Eye Near:   Left Eye Near:    Bilateral Near:     Physical Exam Vitals signs reviewed.  Constitutional:      General: She is not in acute distress.    Appearance: She is well-developed. She is not ill-appearing, toxic-appearing or diaphoretic.     Comments: Alert, cooperative.  Alert and oriented x3.  Mentation within normal limits.  Speech within normal limits.  HENT:     Head: Normocephalic and atraumatic.     Jaw: There is normal jaw occlusion. No tenderness or swelling.     Salivary Glands: Right salivary gland is not tender. Left salivary gland is not tender.     Right Ear: Hearing, tympanic membrane, ear canal and external ear normal. No drainage, swelling or tenderness. No middle ear effusion. There is no impacted cerumen. No foreign body. No mastoid tenderness. Tympanic membrane is not perforated, erythematous or retracted.     Left Ear: Hearing, tympanic membrane and external ear normal. No drainage, swelling or tenderness.  No middle ear effusion. There is no impacted cerumen (Scant nonobstructing earwax in canal.  No definite foreign body). No foreign body. No mastoid tenderness. Tympanic membrane is not perforated, erythematous or retracted.     Ears:     Weber exam findings: does not lateralize.    Nose: Nose  normal.     Right Sinus: No maxillary sinus tenderness or frontal sinus tenderness.     Left Sinus: No maxillary sinus tenderness or frontal sinus tenderness.  Eyes:     General: No scleral icterus.  Extraocular Movements: Extraocular movements intact.     Conjunctiva/sclera: Conjunctivae normal.     Pupils: Pupils are equal, round, and reactive to light.     Comments: perrla  Neck:     Musculoskeletal: Normal range of motion and neck supple. No neck rigidity or pain with movement.     Thyroid: No thyroid mass.  Cardiovascular:     Rate and Rhythm: Normal rate and regular rhythm.     Heart sounds: Normal heart sounds.  Pulmonary:     Effort: Pulmonary effort is normal.     Breath sounds: Normal breath sounds.  Abdominal:     General: There is no distension.  Musculoskeletal:        General: No tenderness.     Right lower leg: No edema.     Left lower leg: No edema.  Lymphadenopathy:     Cervical: No cervical adenopathy.  Skin:    General: Skin is warm and dry.     Capillary Refill: Capillary refill takes less than 2 seconds.     Findings: No rash.  Neurological:     General: No focal deficit present.     Mental Status: She is alert and oriented to person, place, and time.     Cranial Nerves: No cranial nerve deficit.  Psychiatric:        Mood and Affect: Mood normal.        Behavior: Behavior normal.    Romberg negative.  Gait within normal limits  UC Treatments / Results  Labs (all labs ordered are listed, but only abnormal results are displayed) Labs Reviewed - No data to display  EKG   Radiology No results found.  Procedures Procedures (including critical care time)  Medications Ordered in UC Medications - No data to display  Initial Impression / Assessment and Plan / UC Course  I have reviewed the triage vital signs and the nursing notes.  Pertinent labs & imaging results that were available during my care of the patient were reviewed by me and  considered in my medical decision making (see chart for details).     Had prior vague symptoms right ear this morning.  Unknown cause, as right ear exam normal.  Neurologic intact.  No evidence of acute neurologic event. Blowing her nose this morning improved the symptoms, and currently denies any right ear symptoms or problems.  I suspect she had right eustachian tube dysfunction, which is now improved. Final Clinical Impressions(s) / UC Diagnoses   Final diagnoses:  Discomfort of right ear     Discharge Instructions     I know you had a funny feeling in your right ear this morning. I am glad that the feeling in your right ear has gone away and your right ear feels normal and your hearing normal now. On physical exam of right ear: There is no sign of wax blockage.  No sign of infection.  No sign of fluid behind the right ear.  Right eardrum looks normal.   There is no sign of any new stroke. Plans: No particular treatment for your right ear at this point. Please keep the appointment you are made with [facility]NT tomorrow if you have any concerns or right ear symptoms.    Over 25 minutes spent, greater than 50% of the time spent for counseling and coordination of care.  Precautions discussed. Red flags discussed.-Emergency room if any red flag Questions invited and answered. Patient voiced understanding and agreement.     Burnett Harry,  Shanon Brow, MD 10/30/18 [phone]

## 2018-10-30 DIAGNOSIS — H6981 Other specified disorders of Eustachian tube, right ear: Secondary | ICD-10-CM | POA: Diagnosis not present

## 2018-10-30 DIAGNOSIS — H903 Sensorineural hearing loss, bilateral: Secondary | ICD-10-CM | POA: Diagnosis not present

## 2018-10-30 DIAGNOSIS — H838X3 Other specified diseases of inner ear, bilateral: Secondary | ICD-10-CM | POA: Diagnosis not present

## 2018-11-06 DIAGNOSIS — D3910 Neoplasm of uncertain behavior of unspecified ovary: Secondary | ICD-10-CM | POA: Diagnosis not present

## 2018-11-10 DIAGNOSIS — I4510 Unspecified right bundle-branch block: Secondary | ICD-10-CM | POA: Diagnosis not present

## 2018-11-10 DIAGNOSIS — R001 Bradycardia, unspecified: Secondary | ICD-10-CM | POA: Diagnosis not present

## 2018-11-10 DIAGNOSIS — R9431 Abnormal electrocardiogram [ECG] [EKG]: Secondary | ICD-10-CM | POA: Diagnosis not present

## 2018-11-10 DIAGNOSIS — Z79899 Other long term (current) drug therapy: Secondary | ICD-10-CM | POA: Diagnosis not present

## 2018-11-10 DIAGNOSIS — Z7982 Long term (current) use of aspirin: Secondary | ICD-10-CM | POA: Diagnosis not present

## 2018-11-10 DIAGNOSIS — R079 Chest pain, unspecified: Secondary | ICD-10-CM | POA: Diagnosis not present

## 2018-11-10 DIAGNOSIS — I252 Old myocardial infarction: Secondary | ICD-10-CM | POA: Diagnosis not present

## 2018-11-10 DIAGNOSIS — I10 Essential (primary) hypertension: Secondary | ICD-10-CM | POA: Diagnosis not present

## 2018-12-11 DIAGNOSIS — Z01818 Encounter for other preprocedural examination: Secondary | ICD-10-CM | POA: Diagnosis not present

## 2018-12-11 DIAGNOSIS — D3910 Neoplasm of uncertain behavior of unspecified ovary: Secondary | ICD-10-CM | POA: Diagnosis not present

## 2018-12-15 DIAGNOSIS — Z79899 Other long term (current) drug therapy: Secondary | ICD-10-CM | POA: Diagnosis not present

## 2018-12-15 DIAGNOSIS — N838 Other noninflammatory disorders of ovary, fallopian tube and broad ligament: Secondary | ICD-10-CM | POA: Diagnosis not present

## 2018-12-15 DIAGNOSIS — D270 Benign neoplasm of right ovary: Secondary | ICD-10-CM | POA: Diagnosis not present

## 2018-12-15 DIAGNOSIS — Z882 Allergy status to sulfonamides status: Secondary | ICD-10-CM | POA: Diagnosis not present

## 2018-12-15 DIAGNOSIS — I2510 Atherosclerotic heart disease of native coronary artery without angina pectoris: Secondary | ICD-10-CM | POA: Diagnosis not present

## 2018-12-15 DIAGNOSIS — Z888 Allergy status to other drugs, medicaments and biological substances status: Secondary | ICD-10-CM | POA: Diagnosis not present

## 2018-12-15 DIAGNOSIS — R001 Bradycardia, unspecified: Secondary | ICD-10-CM | POA: Diagnosis not present

## 2018-12-15 DIAGNOSIS — D2271 Melanocytic nevi of right lower limb, including hip: Secondary | ICD-10-CM | POA: Diagnosis not present

## 2018-12-15 DIAGNOSIS — Z8543 Personal history of malignant neoplasm of ovary: Secondary | ICD-10-CM | POA: Diagnosis not present

## 2018-12-15 DIAGNOSIS — D271 Benign neoplasm of left ovary: Secondary | ICD-10-CM | POA: Diagnosis not present

## 2018-12-15 DIAGNOSIS — Z8673 Personal history of transient ischemic attack (TIA), and cerebral infarction without residual deficits: Secondary | ICD-10-CM | POA: Diagnosis not present

## 2018-12-15 DIAGNOSIS — Z8041 Family history of malignant neoplasm of ovary: Secondary | ICD-10-CM | POA: Diagnosis not present

## 2018-12-15 DIAGNOSIS — Z853 Personal history of malignant neoplasm of breast: Secondary | ICD-10-CM | POA: Diagnosis not present

## 2018-12-15 DIAGNOSIS — Z880 Allergy status to penicillin: Secondary | ICD-10-CM | POA: Diagnosis not present

## 2018-12-15 DIAGNOSIS — I10 Essential (primary) hypertension: Secondary | ICD-10-CM | POA: Diagnosis not present

## 2018-12-15 DIAGNOSIS — R1909 Other intra-abdominal and pelvic swelling, mass and lump: Secondary | ICD-10-CM | POA: Diagnosis not present

## 2018-12-15 DIAGNOSIS — Z09 Encounter for follow-up examination after completed treatment for conditions other than malignant neoplasm: Secondary | ICD-10-CM | POA: Diagnosis not present

## 2018-12-15 DIAGNOSIS — Z803 Family history of malignant neoplasm of breast: Secondary | ICD-10-CM | POA: Diagnosis not present

## 2018-12-15 DIAGNOSIS — Z91018 Allergy to other foods: Secondary | ICD-10-CM | POA: Diagnosis not present

## 2018-12-15 DIAGNOSIS — I252 Old myocardial infarction: Secondary | ICD-10-CM | POA: Diagnosis not present

## 2019-01-13 DIAGNOSIS — R110 Nausea: Secondary | ICD-10-CM | POA: Diagnosis not present

## 2019-01-13 DIAGNOSIS — R109 Unspecified abdominal pain: Secondary | ICD-10-CM | POA: Diagnosis not present

## 2019-01-13 DIAGNOSIS — K5900 Constipation, unspecified: Secondary | ICD-10-CM | POA: Diagnosis not present

## 2019-01-13 DIAGNOSIS — R1030 Lower abdominal pain, unspecified: Secondary | ICD-10-CM | POA: Diagnosis not present

## 2019-03-24 DIAGNOSIS — K219 Gastro-esophageal reflux disease without esophagitis: Secondary | ICD-10-CM | POA: Diagnosis not present

## 2019-03-24 DIAGNOSIS — R5383 Other fatigue: Secondary | ICD-10-CM | POA: Diagnosis not present

## 2019-03-24 DIAGNOSIS — E785 Hyperlipidemia, unspecified: Secondary | ICD-10-CM | POA: Diagnosis not present

## 2019-03-24 DIAGNOSIS — I10 Essential (primary) hypertension: Secondary | ICD-10-CM | POA: Diagnosis not present

## 2019-03-24 DIAGNOSIS — M899 Disorder of bone, unspecified: Secondary | ICD-10-CM | POA: Diagnosis not present

## 2019-03-24 DIAGNOSIS — Z0000 Encounter for general adult medical examination without abnormal findings: Secondary | ICD-10-CM | POA: Diagnosis not present

## 2019-03-24 DIAGNOSIS — R748 Abnormal levels of other serum enzymes: Secondary | ICD-10-CM | POA: Diagnosis not present

## 2019-03-24 DIAGNOSIS — M8580 Other specified disorders of bone density and structure, unspecified site: Secondary | ICD-10-CM | POA: Diagnosis not present

## 2019-03-24 DIAGNOSIS — E559 Vitamin D deficiency, unspecified: Secondary | ICD-10-CM | POA: Diagnosis not present

## 2019-05-23 DIAGNOSIS — Z23 Encounter for immunization: Secondary | ICD-10-CM | POA: Diagnosis not present

## 2019-06-02 DIAGNOSIS — H35372 Puckering of macula, left eye: Secondary | ICD-10-CM | POA: Diagnosis not present

## 2019-06-02 DIAGNOSIS — H35033 Hypertensive retinopathy, bilateral: Secondary | ICD-10-CM | POA: Diagnosis not present

## 2019-06-02 DIAGNOSIS — H04123 Dry eye syndrome of bilateral lacrimal glands: Secondary | ICD-10-CM | POA: Diagnosis not present

## 2019-06-02 DIAGNOSIS — H26492 Other secondary cataract, left eye: Secondary | ICD-10-CM | POA: Diagnosis not present

## 2019-06-10 IMAGING — DX DG CHEST 2V
2 series · 2 of 2 positions shown · non-contrast
Comparison: 04/24/2011

CLINICAL DATA: Wheeze and cough for 2 weeks

EXAM:
CHEST  2 VIEW

[chest pa]
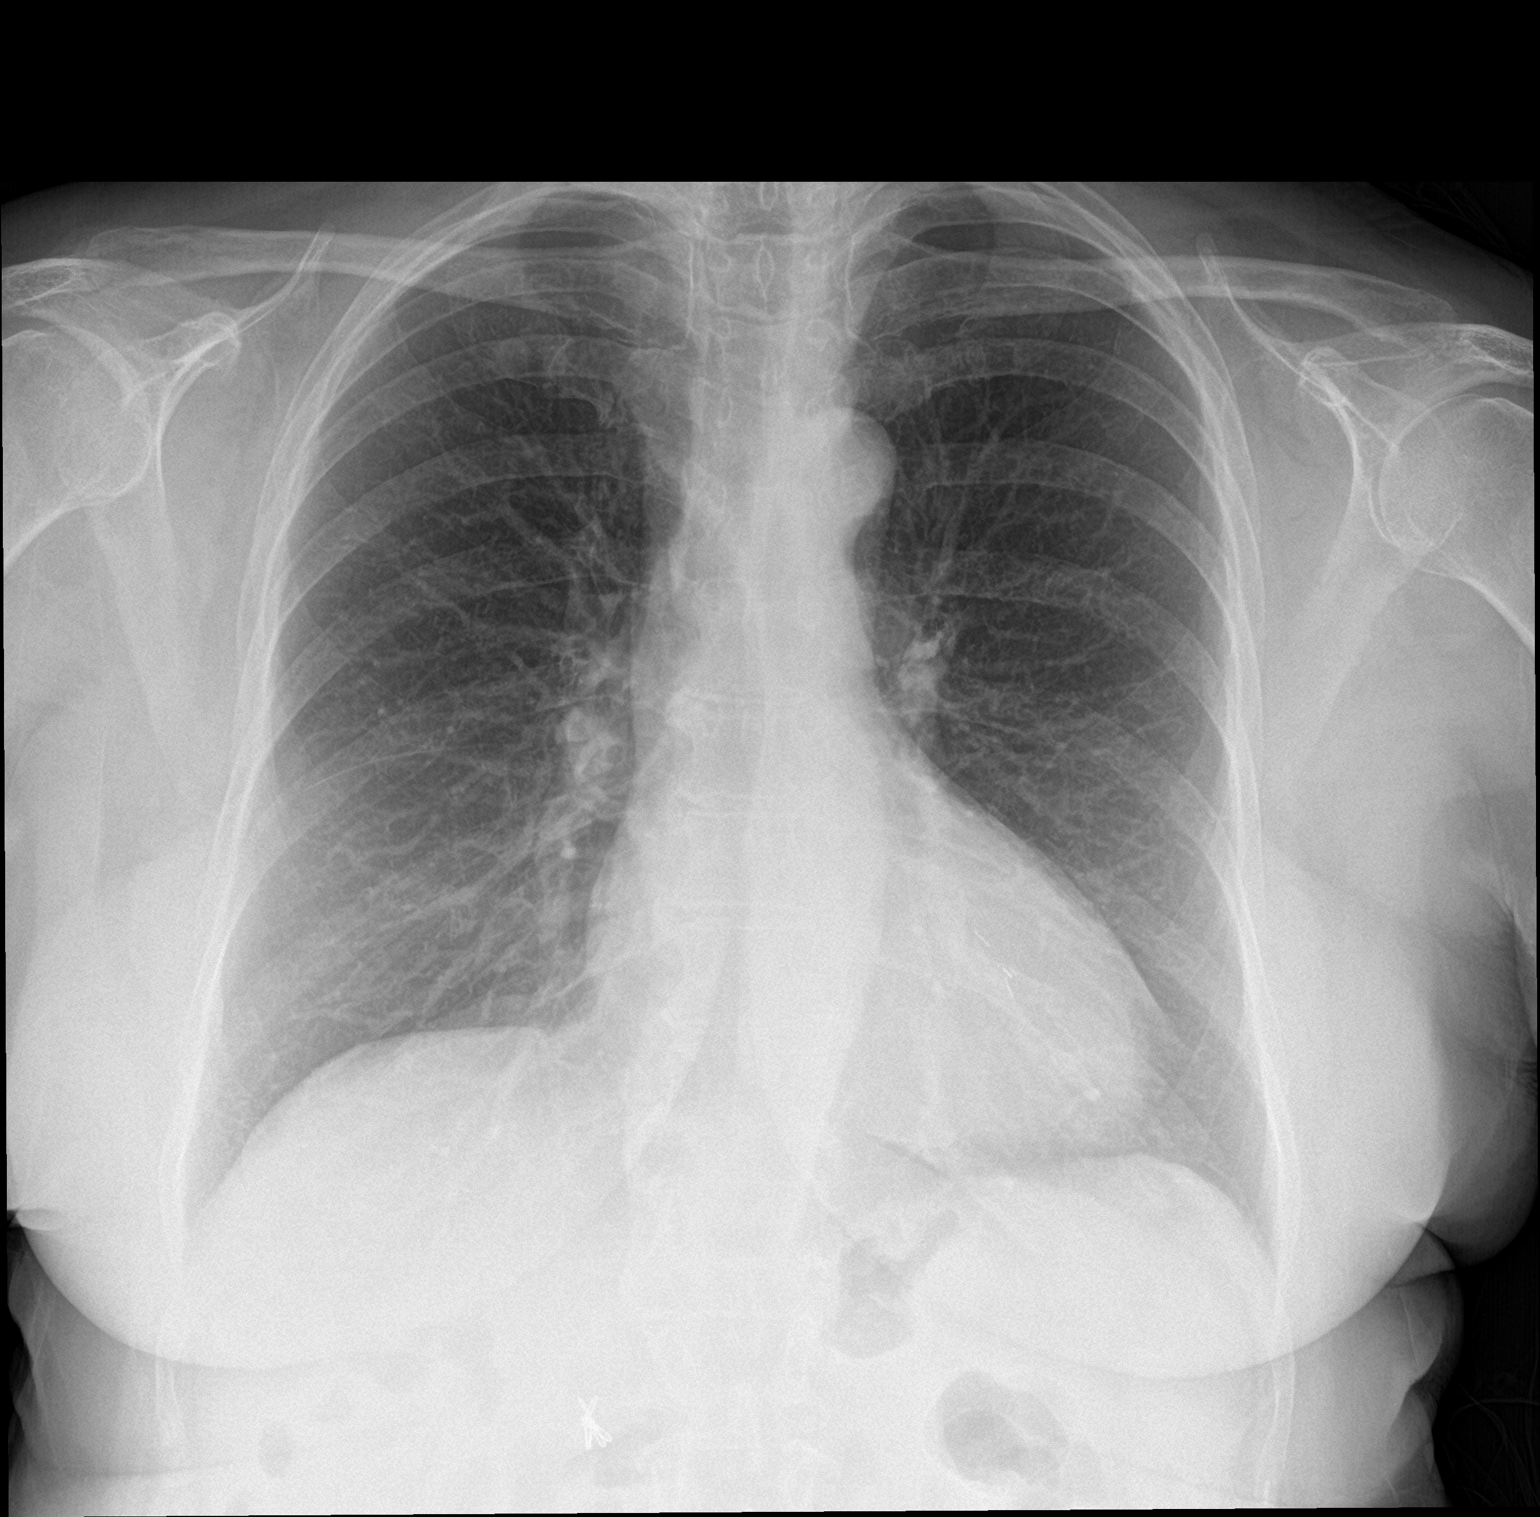

[chest lat]
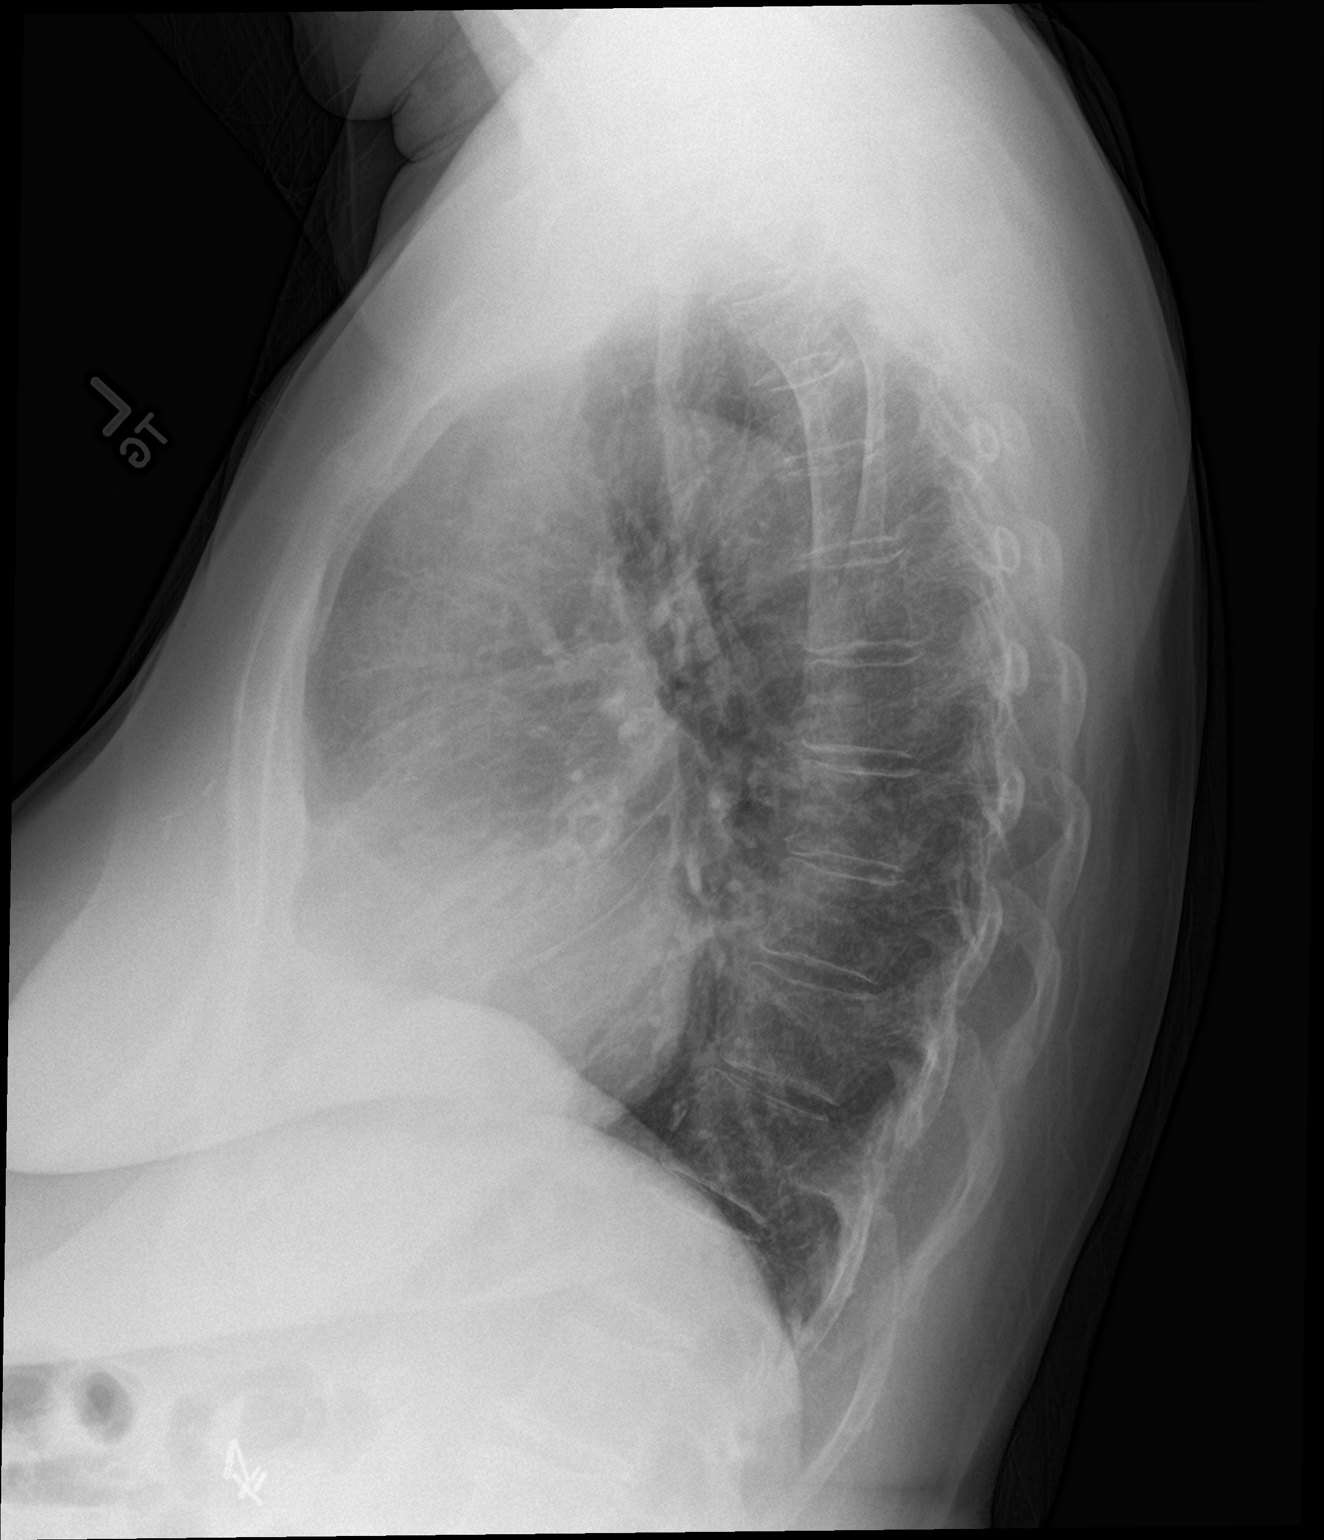

[2 of 2 positions shown; findings below may reference images not displayed]

FINDINGS: Stable borderline heart size. Left breast lumpectomy clips. There is
no edema, consolidation, effusion, or pneumothorax. Cholecystectomy
clips.
IMPRESSION: No evidence of active disease.

## 2019-06-12 DIAGNOSIS — R9431 Abnormal electrocardiogram [ECG] [EKG]: Secondary | ICD-10-CM | POA: Diagnosis not present

## 2019-06-12 DIAGNOSIS — Z8673 Personal history of transient ischemic attack (TIA), and cerebral infarction without residual deficits: Secondary | ICD-10-CM | POA: Diagnosis not present

## 2019-06-12 DIAGNOSIS — I10 Essential (primary) hypertension: Secondary | ICD-10-CM | POA: Diagnosis not present

## 2019-06-12 DIAGNOSIS — I4510 Unspecified right bundle-branch block: Secondary | ICD-10-CM | POA: Diagnosis not present

## 2019-06-12 DIAGNOSIS — R0789 Other chest pain: Secondary | ICD-10-CM | POA: Diagnosis not present

## 2019-06-12 DIAGNOSIS — I252 Old myocardial infarction: Secondary | ICD-10-CM | POA: Diagnosis not present

## 2019-06-12 DIAGNOSIS — M2550 Pain in unspecified joint: Secondary | ICD-10-CM | POA: Diagnosis not present

## 2019-06-13 DIAGNOSIS — Z23 Encounter for immunization: Secondary | ICD-10-CM | POA: Diagnosis not present

## 2019-06-13 DIAGNOSIS — R001 Bradycardia, unspecified: Secondary | ICD-10-CM | POA: Diagnosis not present

## 2019-06-13 DIAGNOSIS — I4510 Unspecified right bundle-branch block: Secondary | ICD-10-CM | POA: Diagnosis not present

## 2019-06-29 DIAGNOSIS — R110 Nausea: Secondary | ICD-10-CM | POA: Diagnosis not present

## 2019-06-29 DIAGNOSIS — R1030 Lower abdominal pain, unspecified: Secondary | ICD-10-CM | POA: Diagnosis not present

## 2019-06-29 DIAGNOSIS — R8290 Unspecified abnormal findings in urine: Secondary | ICD-10-CM | POA: Diagnosis not present

## 2019-06-29 DIAGNOSIS — F419 Anxiety disorder, unspecified: Secondary | ICD-10-CM | POA: Diagnosis not present

## 2019-07-09 DIAGNOSIS — R079 Chest pain, unspecified: Secondary | ICD-10-CM | POA: Diagnosis not present

## 2019-08-31 DIAGNOSIS — Z1231 Encounter for screening mammogram for malignant neoplasm of breast: Secondary | ICD-10-CM | POA: Diagnosis not present

## 2019-09-04 DIAGNOSIS — R748 Abnormal levels of other serum enzymes: Secondary | ICD-10-CM | POA: Diagnosis not present

## 2019-09-04 DIAGNOSIS — K219 Gastro-esophageal reflux disease without esophagitis: Secondary | ICD-10-CM | POA: Diagnosis not present

## 2019-09-04 DIAGNOSIS — E785 Hyperlipidemia, unspecified: Secondary | ICD-10-CM | POA: Diagnosis not present

## 2019-09-04 DIAGNOSIS — I10 Essential (primary) hypertension: Secondary | ICD-10-CM | POA: Diagnosis not present

## 2019-09-04 DIAGNOSIS — M8580 Other specified disorders of bone density and structure, unspecified site: Secondary | ICD-10-CM | POA: Diagnosis not present

## 2019-09-14 DIAGNOSIS — G720 Drug-induced myopathy: Secondary | ICD-10-CM | POA: Diagnosis not present

## 2019-09-14 DIAGNOSIS — I214 Non-ST elevation (NSTEMI) myocardial infarction: Secondary | ICD-10-CM | POA: Diagnosis not present

## 2019-09-14 DIAGNOSIS — T466X5A Adverse effect of antihyperlipidemic and antiarteriosclerotic drugs, initial encounter: Secondary | ICD-10-CM | POA: Diagnosis not present

## 2019-09-14 DIAGNOSIS — I252 Old myocardial infarction: Secondary | ICD-10-CM | POA: Diagnosis not present

## 2019-09-14 DIAGNOSIS — M791 Myalgia, unspecified site: Secondary | ICD-10-CM | POA: Diagnosis not present

## 2019-09-14 DIAGNOSIS — Z79899 Other long term (current) drug therapy: Secondary | ICD-10-CM | POA: Diagnosis not present

## 2019-09-14 DIAGNOSIS — Z8673 Personal history of transient ischemic attack (TIA), and cerebral infarction without residual deficits: Secondary | ICD-10-CM | POA: Diagnosis not present

## 2019-09-14 DIAGNOSIS — I10 Essential (primary) hypertension: Secondary | ICD-10-CM | POA: Diagnosis not present

## 2019-09-14 DIAGNOSIS — T466X5D Adverse effect of antihyperlipidemic and antiarteriosclerotic drugs, subsequent encounter: Secondary | ICD-10-CM | POA: Diagnosis not present

## 2019-09-17 DIAGNOSIS — C50212 Malignant neoplasm of upper-inner quadrant of left female breast: Secondary | ICD-10-CM | POA: Diagnosis not present

## 2019-09-17 DIAGNOSIS — N840 Polyp of corpus uteri: Secondary | ICD-10-CM | POA: Diagnosis not present

## 2019-09-17 DIAGNOSIS — D270 Benign neoplasm of right ovary: Secondary | ICD-10-CM | POA: Diagnosis not present

## 2019-09-17 DIAGNOSIS — M79605 Pain in left leg: Secondary | ICD-10-CM | POA: Diagnosis not present

## 2019-09-17 DIAGNOSIS — E559 Vitamin D deficiency, unspecified: Secondary | ICD-10-CM | POA: Diagnosis not present

## 2019-09-17 DIAGNOSIS — Z79899 Other long term (current) drug therapy: Secondary | ICD-10-CM | POA: Diagnosis not present

## 2019-09-17 DIAGNOSIS — Z1239 Encounter for other screening for malignant neoplasm of breast: Secondary | ICD-10-CM | POA: Diagnosis not present

## 2019-09-17 DIAGNOSIS — D271 Benign neoplasm of left ovary: Secondary | ICD-10-CM | POA: Diagnosis not present

## 2019-09-17 DIAGNOSIS — Z1379 Encounter for other screening for genetic and chromosomal anomalies: Secondary | ICD-10-CM | POA: Diagnosis not present

## 2019-09-17 DIAGNOSIS — N838 Other noninflammatory disorders of ovary, fallopian tube and broad ligament: Secondary | ICD-10-CM | POA: Diagnosis not present

## 2019-09-17 DIAGNOSIS — Z08 Encounter for follow-up examination after completed treatment for malignant neoplasm: Secondary | ICD-10-CM | POA: Diagnosis not present

## 2019-09-17 DIAGNOSIS — I252 Old myocardial infarction: Secondary | ICD-10-CM | POA: Diagnosis not present

## 2019-09-17 DIAGNOSIS — M79604 Pain in right leg: Secondary | ICD-10-CM | POA: Diagnosis not present

## 2019-09-17 DIAGNOSIS — Z87898 Personal history of other specified conditions: Secondary | ICD-10-CM | POA: Diagnosis not present

## 2019-09-17 DIAGNOSIS — I10 Essential (primary) hypertension: Secondary | ICD-10-CM | POA: Diagnosis not present

## 2019-09-17 DIAGNOSIS — Z853 Personal history of malignant neoplasm of breast: Secondary | ICD-10-CM | POA: Diagnosis not present

## 2019-09-17 DIAGNOSIS — Z9189 Other specified personal risk factors, not elsewhere classified: Secondary | ICD-10-CM | POA: Diagnosis not present

## 2019-09-17 DIAGNOSIS — R413 Other amnesia: Secondary | ICD-10-CM | POA: Diagnosis not present

## 2020-02-03 DIAGNOSIS — D1801 Hemangioma of skin and subcutaneous tissue: Secondary | ICD-10-CM | POA: Diagnosis not present

## 2020-02-03 DIAGNOSIS — L820 Inflamed seborrheic keratosis: Secondary | ICD-10-CM | POA: Diagnosis not present

## 2020-02-03 DIAGNOSIS — L821 Other seborrheic keratosis: Secondary | ICD-10-CM | POA: Diagnosis not present

## 2020-02-03 DIAGNOSIS — B351 Tinea unguium: Secondary | ICD-10-CM | POA: Diagnosis not present

## 2020-03-09 DIAGNOSIS — Z23 Encounter for immunization: Secondary | ICD-10-CM | POA: Diagnosis not present

## 2020-03-14 DIAGNOSIS — I63512 Cerebral infarction due to unspecified occlusion or stenosis of left middle cerebral artery: Secondary | ICD-10-CM | POA: Diagnosis not present

## 2020-03-14 DIAGNOSIS — Z79899 Other long term (current) drug therapy: Secondary | ICD-10-CM | POA: Diagnosis not present

## 2020-03-14 DIAGNOSIS — Z7982 Long term (current) use of aspirin: Secondary | ICD-10-CM | POA: Diagnosis not present

## 2020-03-14 DIAGNOSIS — I214 Non-ST elevation (NSTEMI) myocardial infarction: Secondary | ICD-10-CM | POA: Diagnosis not present

## 2020-03-14 DIAGNOSIS — I252 Old myocardial infarction: Secondary | ICD-10-CM | POA: Diagnosis not present

## 2020-03-14 DIAGNOSIS — I10 Essential (primary) hypertension: Secondary | ICD-10-CM | POA: Diagnosis not present

## 2020-03-14 DIAGNOSIS — Z8673 Personal history of transient ischemic attack (TIA), and cerebral infarction without residual deficits: Secondary | ICD-10-CM | POA: Diagnosis not present

## 2020-03-14 DIAGNOSIS — I4510 Unspecified right bundle-branch block: Secondary | ICD-10-CM | POA: Diagnosis not present

## 2020-03-17 DIAGNOSIS — M8580 Other specified disorders of bone density and structure, unspecified site: Secondary | ICD-10-CM | POA: Diagnosis not present

## 2020-03-17 DIAGNOSIS — E559 Vitamin D deficiency, unspecified: Secondary | ICD-10-CM | POA: Diagnosis not present

## 2020-03-17 DIAGNOSIS — Z01419 Encounter for gynecological examination (general) (routine) without abnormal findings: Secondary | ICD-10-CM | POA: Diagnosis not present

## 2020-03-17 DIAGNOSIS — R5383 Other fatigue: Secondary | ICD-10-CM | POA: Diagnosis not present

## 2020-03-17 DIAGNOSIS — R748 Abnormal levels of other serum enzymes: Secondary | ICD-10-CM | POA: Diagnosis not present

## 2020-03-17 DIAGNOSIS — L309 Dermatitis, unspecified: Secondary | ICD-10-CM | POA: Diagnosis not present

## 2020-03-17 DIAGNOSIS — Z0000 Encounter for general adult medical examination without abnormal findings: Secondary | ICD-10-CM | POA: Diagnosis not present

## 2020-03-17 DIAGNOSIS — M899 Disorder of bone, unspecified: Secondary | ICD-10-CM | POA: Diagnosis not present

## 2020-03-17 DIAGNOSIS — R739 Hyperglycemia, unspecified: Secondary | ICD-10-CM | POA: Diagnosis not present

## 2020-03-17 DIAGNOSIS — E785 Hyperlipidemia, unspecified: Secondary | ICD-10-CM | POA: Diagnosis not present

## 2020-03-17 DIAGNOSIS — I10 Essential (primary) hypertension: Secondary | ICD-10-CM | POA: Diagnosis not present

## 2020-04-05 DIAGNOSIS — M85852 Other specified disorders of bone density and structure, left thigh: Secondary | ICD-10-CM | POA: Diagnosis not present

## 2020-04-05 DIAGNOSIS — M810 Age-related osteoporosis without current pathological fracture: Secondary | ICD-10-CM | POA: Diagnosis not present

## 2020-04-05 DIAGNOSIS — M8588 Other specified disorders of bone density and structure, other site: Secondary | ICD-10-CM | POA: Diagnosis not present

## 2020-04-27 DIAGNOSIS — B351 Tinea unguium: Secondary | ICD-10-CM | POA: Diagnosis not present

## 2020-04-27 DIAGNOSIS — L821 Other seborrheic keratosis: Secondary | ICD-10-CM | POA: Diagnosis not present

## 2020-04-27 DIAGNOSIS — L820 Inflamed seborrheic keratosis: Secondary | ICD-10-CM | POA: Diagnosis not present

## 2020-05-26 DIAGNOSIS — R6889 Other general symptoms and signs: Secondary | ICD-10-CM | POA: Diagnosis not present

## 2020-05-26 DIAGNOSIS — J988 Other specified respiratory disorders: Secondary | ICD-10-CM | POA: Diagnosis not present

## 2020-05-26 DIAGNOSIS — R0989 Other specified symptoms and signs involving the circulatory and respiratory systems: Secondary | ICD-10-CM | POA: Diagnosis not present

## 2020-05-26 DIAGNOSIS — R350 Frequency of micturition: Secondary | ICD-10-CM | POA: Diagnosis not present

## 2020-05-26 DIAGNOSIS — J069 Acute upper respiratory infection, unspecified: Secondary | ICD-10-CM | POA: Diagnosis not present

## 2020-06-06 DIAGNOSIS — H35372 Puckering of macula, left eye: Secondary | ICD-10-CM | POA: Diagnosis not present

## 2020-06-06 DIAGNOSIS — H04123 Dry eye syndrome of bilateral lacrimal glands: Secondary | ICD-10-CM | POA: Diagnosis not present

## 2020-06-06 DIAGNOSIS — H35033 Hypertensive retinopathy, bilateral: Secondary | ICD-10-CM | POA: Diagnosis not present

## 2020-06-06 DIAGNOSIS — H26492 Other secondary cataract, left eye: Secondary | ICD-10-CM | POA: Diagnosis not present

## 2020-08-31 DIAGNOSIS — I2510 Atherosclerotic heart disease of native coronary artery without angina pectoris: Secondary | ICD-10-CM | POA: Diagnosis not present

## 2020-08-31 DIAGNOSIS — R531 Weakness: Secondary | ICD-10-CM | POA: Diagnosis not present

## 2020-08-31 DIAGNOSIS — I10 Essential (primary) hypertension: Secondary | ICD-10-CM | POA: Diagnosis not present

## 2020-08-31 DIAGNOSIS — R29818 Other symptoms and signs involving the nervous system: Secondary | ICD-10-CM | POA: Diagnosis not present

## 2020-08-31 DIAGNOSIS — Z880 Allergy status to penicillin: Secondary | ICD-10-CM | POA: Diagnosis not present

## 2020-08-31 DIAGNOSIS — E876 Hypokalemia: Secondary | ICD-10-CM | POA: Diagnosis not present

## 2020-08-31 DIAGNOSIS — I491 Atrial premature depolarization: Secondary | ICD-10-CM | POA: Diagnosis not present

## 2020-08-31 DIAGNOSIS — I252 Old myocardial infarction: Secondary | ICD-10-CM | POA: Diagnosis not present

## 2020-08-31 DIAGNOSIS — Z888 Allergy status to other drugs, medicaments and biological substances status: Secondary | ICD-10-CM | POA: Diagnosis not present

## 2020-08-31 DIAGNOSIS — Z853 Personal history of malignant neoplasm of breast: Secondary | ICD-10-CM | POA: Diagnosis not present

## 2020-08-31 DIAGNOSIS — I071 Rheumatic tricuspid insufficiency: Secondary | ICD-10-CM | POA: Diagnosis not present

## 2020-08-31 DIAGNOSIS — R413 Other amnesia: Secondary | ICD-10-CM | POA: Diagnosis not present

## 2020-08-31 DIAGNOSIS — R29704 NIHSS score 4: Secondary | ICD-10-CM | POA: Diagnosis not present

## 2020-08-31 DIAGNOSIS — I63512 Cerebral infarction due to unspecified occlusion or stenosis of left middle cerebral artery: Secondary | ICD-10-CM | POA: Diagnosis not present

## 2020-08-31 DIAGNOSIS — K219 Gastro-esophageal reflux disease without esophagitis: Secondary | ICD-10-CM | POA: Diagnosis not present

## 2020-08-31 DIAGNOSIS — Z882 Allergy status to sulfonamides status: Secondary | ICD-10-CM | POA: Diagnosis not present

## 2020-08-31 DIAGNOSIS — I639 Cerebral infarction, unspecified: Secondary | ICD-10-CM | POA: Diagnosis not present

## 2020-08-31 DIAGNOSIS — R29810 Facial weakness: Secondary | ICD-10-CM | POA: Diagnosis not present

## 2020-08-31 DIAGNOSIS — E785 Hyperlipidemia, unspecified: Secondary | ICD-10-CM | POA: Diagnosis not present

## 2020-08-31 DIAGNOSIS — I6602 Occlusion and stenosis of left middle cerebral artery: Secondary | ICD-10-CM | POA: Diagnosis not present

## 2020-08-31 DIAGNOSIS — I6781 Acute cerebrovascular insufficiency: Secondary | ICD-10-CM | POA: Diagnosis not present

## 2020-08-31 DIAGNOSIS — I63412 Cerebral infarction due to embolism of left middle cerebral artery: Secondary | ICD-10-CM | POA: Diagnosis not present

## 2020-09-04 DIAGNOSIS — R413 Other amnesia: Secondary | ICD-10-CM | POA: Diagnosis not present

## 2020-09-04 DIAGNOSIS — I252 Old myocardial infarction: Secondary | ICD-10-CM | POA: Diagnosis not present

## 2020-09-04 DIAGNOSIS — Z79899 Other long term (current) drug therapy: Secondary | ICD-10-CM | POA: Diagnosis not present

## 2020-09-04 DIAGNOSIS — K219 Gastro-esophageal reflux disease without esophagitis: Secondary | ICD-10-CM | POA: Diagnosis not present

## 2020-09-04 DIAGNOSIS — Z853 Personal history of malignant neoplasm of breast: Secondary | ICD-10-CM | POA: Diagnosis not present

## 2020-09-04 DIAGNOSIS — I69320 Aphasia following cerebral infarction: Secondary | ICD-10-CM | POA: Diagnosis not present

## 2020-09-04 DIAGNOSIS — I69351 Hemiplegia and hemiparesis following cerebral infarction affecting right dominant side: Secondary | ICD-10-CM | POA: Diagnosis not present

## 2020-09-04 DIAGNOSIS — Z7982 Long term (current) use of aspirin: Secondary | ICD-10-CM | POA: Diagnosis not present

## 2020-09-04 DIAGNOSIS — E7849 Other hyperlipidemia: Secondary | ICD-10-CM | POA: Diagnosis not present

## 2020-09-04 DIAGNOSIS — I10 Essential (primary) hypertension: Secondary | ICD-10-CM | POA: Diagnosis not present

## 2020-09-07 DIAGNOSIS — I480 Paroxysmal atrial fibrillation: Secondary | ICD-10-CM | POA: Diagnosis not present

## 2020-09-14 DIAGNOSIS — Z8673 Personal history of transient ischemic attack (TIA), and cerebral infarction without residual deficits: Secondary | ICD-10-CM | POA: Diagnosis not present

## 2020-09-15 DIAGNOSIS — I63512 Cerebral infarction due to unspecified occlusion or stenosis of left middle cerebral artery: Secondary | ICD-10-CM | POA: Diagnosis not present

## 2020-09-15 DIAGNOSIS — I10 Essential (primary) hypertension: Secondary | ICD-10-CM | POA: Diagnosis not present

## 2020-09-15 DIAGNOSIS — E876 Hypokalemia: Secondary | ICD-10-CM | POA: Diagnosis not present

## 2020-09-15 DIAGNOSIS — D649 Anemia, unspecified: Secondary | ICD-10-CM | POA: Diagnosis not present

## 2020-09-16 DIAGNOSIS — I10 Essential (primary) hypertension: Secondary | ICD-10-CM | POA: Diagnosis not present

## 2020-09-16 DIAGNOSIS — D649 Anemia, unspecified: Secondary | ICD-10-CM | POA: Diagnosis not present

## 2020-09-16 DIAGNOSIS — E876 Hypokalemia: Secondary | ICD-10-CM | POA: Diagnosis not present

## 2020-09-20 DIAGNOSIS — Z8673 Personal history of transient ischemic attack (TIA), and cerebral infarction without residual deficits: Secondary | ICD-10-CM | POA: Diagnosis not present

## 2020-09-20 DIAGNOSIS — Z170 Estrogen receptor positive status [ER+]: Secondary | ICD-10-CM | POA: Diagnosis not present

## 2020-09-20 DIAGNOSIS — I63512 Cerebral infarction due to unspecified occlusion or stenosis of left middle cerebral artery: Secondary | ICD-10-CM | POA: Diagnosis not present

## 2020-09-20 DIAGNOSIS — C50212 Malignant neoplasm of upper-inner quadrant of left female breast: Secondary | ICD-10-CM | POA: Diagnosis not present

## 2020-10-02 DIAGNOSIS — I491 Atrial premature depolarization: Secondary | ICD-10-CM | POA: Diagnosis not present

## 2020-10-02 DIAGNOSIS — I471 Supraventricular tachycardia: Secondary | ICD-10-CM | POA: Diagnosis not present

## 2020-10-02 DIAGNOSIS — I4510 Unspecified right bundle-branch block: Secondary | ICD-10-CM | POA: Diagnosis not present

## 2020-10-04 DIAGNOSIS — I69320 Aphasia following cerebral infarction: Secondary | ICD-10-CM | POA: Diagnosis not present

## 2020-10-04 DIAGNOSIS — Z853 Personal history of malignant neoplasm of breast: Secondary | ICD-10-CM | POA: Diagnosis not present

## 2020-10-04 DIAGNOSIS — I69351 Hemiplegia and hemiparesis following cerebral infarction affecting right dominant side: Secondary | ICD-10-CM | POA: Diagnosis not present

## 2020-10-04 DIAGNOSIS — E7849 Other hyperlipidemia: Secondary | ICD-10-CM | POA: Diagnosis not present

## 2020-10-04 DIAGNOSIS — K219 Gastro-esophageal reflux disease without esophagitis: Secondary | ICD-10-CM | POA: Diagnosis not present

## 2020-10-04 DIAGNOSIS — Z7982 Long term (current) use of aspirin: Secondary | ICD-10-CM | POA: Diagnosis not present

## 2020-10-04 DIAGNOSIS — R413 Other amnesia: Secondary | ICD-10-CM | POA: Diagnosis not present

## 2020-10-04 DIAGNOSIS — I10 Essential (primary) hypertension: Secondary | ICD-10-CM | POA: Diagnosis not present

## 2020-10-04 DIAGNOSIS — Z79899 Other long term (current) drug therapy: Secondary | ICD-10-CM | POA: Diagnosis not present

## 2020-10-04 DIAGNOSIS — I252 Old myocardial infarction: Secondary | ICD-10-CM | POA: Diagnosis not present

## 2020-10-19 DIAGNOSIS — I480 Paroxysmal atrial fibrillation: Secondary | ICD-10-CM | POA: Diagnosis not present

## 2020-10-19 DIAGNOSIS — I471 Supraventricular tachycardia: Secondary | ICD-10-CM | POA: Diagnosis not present

## 2020-10-19 DIAGNOSIS — I10 Essential (primary) hypertension: Secondary | ICD-10-CM | POA: Diagnosis not present

## 2020-10-19 DIAGNOSIS — I2510 Atherosclerotic heart disease of native coronary artery without angina pectoris: Secondary | ICD-10-CM | POA: Diagnosis not present

## 2020-11-07 DIAGNOSIS — I10 Essential (primary) hypertension: Secondary | ICD-10-CM | POA: Diagnosis not present

## 2020-11-07 DIAGNOSIS — I480 Paroxysmal atrial fibrillation: Secondary | ICD-10-CM | POA: Diagnosis not present

## 2020-11-07 DIAGNOSIS — Z7901 Long term (current) use of anticoagulants: Secondary | ICD-10-CM | POA: Diagnosis not present

## 2020-11-07 DIAGNOSIS — Z8673 Personal history of transient ischemic attack (TIA), and cerebral infarction without residual deficits: Secondary | ICD-10-CM | POA: Diagnosis not present

## 2020-11-12 DIAGNOSIS — I499 Cardiac arrhythmia, unspecified: Secondary | ICD-10-CM | POA: Diagnosis not present

## 2020-11-12 DIAGNOSIS — I480 Paroxysmal atrial fibrillation: Secondary | ICD-10-CM | POA: Diagnosis not present

## 2020-11-12 DIAGNOSIS — I4510 Unspecified right bundle-branch block: Secondary | ICD-10-CM | POA: Diagnosis not present

## 2020-11-14 DIAGNOSIS — Z1231 Encounter for screening mammogram for malignant neoplasm of breast: Secondary | ICD-10-CM | POA: Diagnosis not present

## 2021-01-17 DIAGNOSIS — Z8673 Personal history of transient ischemic attack (TIA), and cerebral infarction without residual deficits: Secondary | ICD-10-CM | POA: Diagnosis not present

## 2021-01-23 DIAGNOSIS — E785 Hyperlipidemia, unspecified: Secondary | ICD-10-CM | POA: Diagnosis not present

## 2021-01-23 DIAGNOSIS — I10 Essential (primary) hypertension: Secondary | ICD-10-CM | POA: Diagnosis not present

## 2021-01-23 DIAGNOSIS — Z7901 Long term (current) use of anticoagulants: Secondary | ICD-10-CM | POA: Diagnosis not present

## 2021-01-23 DIAGNOSIS — I63512 Cerebral infarction due to unspecified occlusion or stenosis of left middle cerebral artery: Secondary | ICD-10-CM | POA: Diagnosis not present

## 2021-01-23 DIAGNOSIS — I2510 Atherosclerotic heart disease of native coronary artery without angina pectoris: Secondary | ICD-10-CM | POA: Diagnosis not present

## 2021-02-13 DIAGNOSIS — Z8673 Personal history of transient ischemic attack (TIA), and cerebral infarction without residual deficits: Secondary | ICD-10-CM | POA: Diagnosis not present

## 2021-02-13 DIAGNOSIS — I10 Essential (primary) hypertension: Secondary | ICD-10-CM | POA: Diagnosis not present

## 2021-02-13 DIAGNOSIS — I480 Paroxysmal atrial fibrillation: Secondary | ICD-10-CM | POA: Diagnosis not present

## 2021-02-13 DIAGNOSIS — I491 Atrial premature depolarization: Secondary | ICD-10-CM | POA: Diagnosis not present

## 2021-02-13 DIAGNOSIS — Z7901 Long term (current) use of anticoagulants: Secondary | ICD-10-CM | POA: Diagnosis not present

## 2021-02-14 DIAGNOSIS — I491 Atrial premature depolarization: Secondary | ICD-10-CM | POA: Diagnosis not present

## 2021-02-14 DIAGNOSIS — I480 Paroxysmal atrial fibrillation: Secondary | ICD-10-CM | POA: Diagnosis not present

## 2021-02-14 DIAGNOSIS — I4510 Unspecified right bundle-branch block: Secondary | ICD-10-CM | POA: Diagnosis not present

## 2021-02-14 DIAGNOSIS — I499 Cardiac arrhythmia, unspecified: Secondary | ICD-10-CM | POA: Diagnosis not present

## 2021-03-22 DIAGNOSIS — J309 Allergic rhinitis, unspecified: Secondary | ICD-10-CM | POA: Diagnosis not present

## 2021-03-22 DIAGNOSIS — R634 Abnormal weight loss: Secondary | ICD-10-CM | POA: Diagnosis not present

## 2021-03-22 DIAGNOSIS — M8580 Other specified disorders of bone density and structure, unspecified site: Secondary | ICD-10-CM | POA: Diagnosis not present

## 2021-03-22 DIAGNOSIS — Z0000 Encounter for general adult medical examination without abnormal findings: Secondary | ICD-10-CM | POA: Diagnosis not present

## 2021-03-22 DIAGNOSIS — I10 Essential (primary) hypertension: Secondary | ICD-10-CM | POA: Diagnosis not present

## 2021-03-22 DIAGNOSIS — M899 Disorder of bone, unspecified: Secondary | ICD-10-CM | POA: Diagnosis not present

## 2021-03-22 DIAGNOSIS — E559 Vitamin D deficiency, unspecified: Secondary | ICD-10-CM | POA: Diagnosis not present

## 2021-03-22 DIAGNOSIS — R739 Hyperglycemia, unspecified: Secondary | ICD-10-CM | POA: Diagnosis not present

## 2021-03-22 DIAGNOSIS — E785 Hyperlipidemia, unspecified: Secondary | ICD-10-CM | POA: Diagnosis not present

## 2021-03-22 DIAGNOSIS — K219 Gastro-esophageal reflux disease without esophagitis: Secondary | ICD-10-CM | POA: Diagnosis not present

## 2021-03-22 DIAGNOSIS — R5383 Other fatigue: Secondary | ICD-10-CM | POA: Diagnosis not present

## 2021-03-22 DIAGNOSIS — R748 Abnormal levels of other serum enzymes: Secondary | ICD-10-CM | POA: Diagnosis not present

## 2021-03-22 DIAGNOSIS — Z01419 Encounter for gynecological examination (general) (routine) without abnormal findings: Secondary | ICD-10-CM | POA: Diagnosis not present

## 2021-03-22 DIAGNOSIS — L309 Dermatitis, unspecified: Secondary | ICD-10-CM | POA: Diagnosis not present

## 2021-04-03 DIAGNOSIS — I2510 Atherosclerotic heart disease of native coronary artery without angina pectoris: Secondary | ICD-10-CM | POA: Diagnosis not present

## 2021-04-03 DIAGNOSIS — I480 Paroxysmal atrial fibrillation: Secondary | ICD-10-CM | POA: Diagnosis not present

## 2021-04-03 DIAGNOSIS — I10 Essential (primary) hypertension: Secondary | ICD-10-CM | POA: Diagnosis not present

## 2021-04-03 DIAGNOSIS — Z8673 Personal history of transient ischemic attack (TIA), and cerebral infarction without residual deficits: Secondary | ICD-10-CM | POA: Diagnosis not present

## 2021-04-03 DIAGNOSIS — I63512 Cerebral infarction due to unspecified occlusion or stenosis of left middle cerebral artery: Secondary | ICD-10-CM | POA: Diagnosis not present

## 2021-04-03 DIAGNOSIS — I4891 Unspecified atrial fibrillation: Secondary | ICD-10-CM | POA: Diagnosis not present

## 2021-04-03 DIAGNOSIS — Z79899 Other long term (current) drug therapy: Secondary | ICD-10-CM | POA: Diagnosis not present

## 2021-04-03 DIAGNOSIS — Z7901 Long term (current) use of anticoagulants: Secondary | ICD-10-CM | POA: Diagnosis not present

## 2021-04-03 DIAGNOSIS — I4510 Unspecified right bundle-branch block: Secondary | ICD-10-CM | POA: Diagnosis not present

## 2021-04-03 DIAGNOSIS — R012 Other cardiac sounds: Secondary | ICD-10-CM | POA: Diagnosis not present

## 2021-06-13 DIAGNOSIS — H35033 Hypertensive retinopathy, bilateral: Secondary | ICD-10-CM | POA: Diagnosis not present

## 2021-06-13 DIAGNOSIS — H26492 Other secondary cataract, left eye: Secondary | ICD-10-CM | POA: Diagnosis not present

## 2021-06-13 DIAGNOSIS — H35372 Puckering of macula, left eye: Secondary | ICD-10-CM | POA: Diagnosis not present

## 2021-06-13 DIAGNOSIS — H04123 Dry eye syndrome of bilateral lacrimal glands: Secondary | ICD-10-CM | POA: Diagnosis not present

## 2021-06-13 DIAGNOSIS — H43813 Vitreous degeneration, bilateral: Secondary | ICD-10-CM | POA: Diagnosis not present

## 2021-07-13 DIAGNOSIS — K219 Gastro-esophageal reflux disease without esophagitis: Secondary | ICD-10-CM | POA: Diagnosis not present

## 2021-07-13 DIAGNOSIS — I10 Essential (primary) hypertension: Secondary | ICD-10-CM | POA: Diagnosis not present

## 2021-07-13 DIAGNOSIS — R739 Hyperglycemia, unspecified: Secondary | ICD-10-CM | POA: Diagnosis not present

## 2021-07-13 DIAGNOSIS — E785 Hyperlipidemia, unspecified: Secondary | ICD-10-CM | POA: Diagnosis not present

## 2021-07-14 DIAGNOSIS — I10 Essential (primary) hypertension: Secondary | ICD-10-CM | POA: Diagnosis not present

## 2021-07-14 DIAGNOSIS — E785 Hyperlipidemia, unspecified: Secondary | ICD-10-CM | POA: Diagnosis not present

## 2021-07-14 DIAGNOSIS — R739 Hyperglycemia, unspecified: Secondary | ICD-10-CM | POA: Diagnosis not present

## 2021-08-21 DIAGNOSIS — R197 Diarrhea, unspecified: Secondary | ICD-10-CM | POA: Diagnosis not present

## 2021-08-21 DIAGNOSIS — K649 Unspecified hemorrhoids: Secondary | ICD-10-CM | POA: Diagnosis not present

## 2021-08-21 DIAGNOSIS — K921 Melena: Secondary | ICD-10-CM | POA: Diagnosis not present

## 2021-08-21 DIAGNOSIS — K51519 Left sided colitis with unspecified complications: Secondary | ICD-10-CM | POA: Diagnosis not present

## 2021-08-22 DIAGNOSIS — K51519 Left sided colitis with unspecified complications: Secondary | ICD-10-CM | POA: Diagnosis not present

## 2021-08-22 DIAGNOSIS — K649 Unspecified hemorrhoids: Secondary | ICD-10-CM | POA: Diagnosis not present

## 2021-08-22 DIAGNOSIS — K921 Melena: Secondary | ICD-10-CM | POA: Diagnosis not present

## 2021-08-22 DIAGNOSIS — R197 Diarrhea, unspecified: Secondary | ICD-10-CM | POA: Diagnosis not present

## 2021-08-24 DIAGNOSIS — Z881 Allergy status to other antibiotic agents status: Secondary | ICD-10-CM | POA: Diagnosis not present

## 2021-08-24 DIAGNOSIS — Z8673 Personal history of transient ischemic attack (TIA), and cerebral infarction without residual deficits: Secondary | ICD-10-CM | POA: Diagnosis not present

## 2021-08-24 DIAGNOSIS — Z880 Allergy status to penicillin: Secondary | ICD-10-CM | POA: Diagnosis not present

## 2021-08-24 DIAGNOSIS — R079 Chest pain, unspecified: Secondary | ICD-10-CM | POA: Diagnosis not present

## 2021-08-24 DIAGNOSIS — Z79899 Other long term (current) drug therapy: Secondary | ICD-10-CM | POA: Diagnosis not present

## 2021-08-24 DIAGNOSIS — A040 Enteropathogenic Escherichia coli infection: Secondary | ICD-10-CM | POA: Diagnosis not present

## 2021-08-24 DIAGNOSIS — M79602 Pain in left arm: Secondary | ICD-10-CM | POA: Diagnosis not present

## 2021-08-24 DIAGNOSIS — Z882 Allergy status to sulfonamides status: Secondary | ICD-10-CM | POA: Diagnosis not present

## 2021-08-24 DIAGNOSIS — B9629 Other Escherichia coli [E. coli] as the cause of diseases classified elsewhere: Secondary | ICD-10-CM | POA: Diagnosis not present

## 2021-08-24 DIAGNOSIS — I10 Essential (primary) hypertension: Secondary | ICD-10-CM | POA: Diagnosis not present

## 2021-08-24 DIAGNOSIS — Z888 Allergy status to other drugs, medicaments and biological substances status: Secondary | ICD-10-CM | POA: Diagnosis not present

## 2021-08-24 DIAGNOSIS — I252 Old myocardial infarction: Secondary | ICD-10-CM | POA: Diagnosis not present

## 2021-09-20 DIAGNOSIS — R413 Other amnesia: Secondary | ICD-10-CM | POA: Diagnosis not present

## 2021-09-20 DIAGNOSIS — E559 Vitamin D deficiency, unspecified: Secondary | ICD-10-CM | POA: Diagnosis not present

## 2021-09-20 DIAGNOSIS — Z9189 Other specified personal risk factors, not elsewhere classified: Secondary | ICD-10-CM | POA: Diagnosis not present

## 2021-09-20 DIAGNOSIS — I63512 Cerebral infarction due to unspecified occlusion or stenosis of left middle cerebral artery: Secondary | ICD-10-CM | POA: Diagnosis not present

## 2021-09-20 DIAGNOSIS — Z8673 Personal history of transient ischemic attack (TIA), and cerebral infarction without residual deficits: Secondary | ICD-10-CM | POA: Diagnosis not present

## 2021-09-20 DIAGNOSIS — Z1239 Encounter for other screening for malignant neoplasm of breast: Secondary | ICD-10-CM | POA: Diagnosis not present

## 2021-09-20 DIAGNOSIS — I10 Essential (primary) hypertension: Secondary | ICD-10-CM | POA: Diagnosis not present

## 2021-09-20 DIAGNOSIS — Z1379 Encounter for other screening for genetic and chromosomal anomalies: Secondary | ICD-10-CM | POA: Diagnosis not present

## 2021-09-20 DIAGNOSIS — Z170 Estrogen receptor positive status [ER+]: Secondary | ICD-10-CM | POA: Diagnosis not present

## 2021-09-20 DIAGNOSIS — C50212 Malignant neoplasm of upper-inner quadrant of left female breast: Secondary | ICD-10-CM | POA: Diagnosis not present

## 2021-09-20 DIAGNOSIS — Z1231 Encounter for screening mammogram for malignant neoplasm of breast: Secondary | ICD-10-CM | POA: Diagnosis not present

## 2021-09-20 DIAGNOSIS — D271 Benign neoplasm of left ovary: Secondary | ICD-10-CM | POA: Diagnosis not present

## 2021-10-02 DIAGNOSIS — I252 Old myocardial infarction: Secondary | ICD-10-CM | POA: Diagnosis not present

## 2021-10-02 DIAGNOSIS — I2510 Atherosclerotic heart disease of native coronary artery without angina pectoris: Secondary | ICD-10-CM | POA: Diagnosis not present

## 2021-10-02 DIAGNOSIS — I10 Essential (primary) hypertension: Secondary | ICD-10-CM | POA: Diagnosis not present

## 2021-10-02 DIAGNOSIS — Z79899 Other long term (current) drug therapy: Secondary | ICD-10-CM | POA: Diagnosis not present

## 2021-10-02 DIAGNOSIS — R001 Bradycardia, unspecified: Secondary | ICD-10-CM | POA: Diagnosis not present

## 2021-10-02 DIAGNOSIS — I63512 Cerebral infarction due to unspecified occlusion or stenosis of left middle cerebral artery: Secondary | ICD-10-CM | POA: Diagnosis not present

## 2021-10-02 DIAGNOSIS — E785 Hyperlipidemia, unspecified: Secondary | ICD-10-CM | POA: Diagnosis not present

## 2021-10-02 DIAGNOSIS — I4510 Unspecified right bundle-branch block: Secondary | ICD-10-CM | POA: Diagnosis not present

## 2021-10-02 DIAGNOSIS — I480 Paroxysmal atrial fibrillation: Secondary | ICD-10-CM | POA: Diagnosis not present

## 2021-10-27 DIAGNOSIS — Z79899 Other long term (current) drug therapy: Secondary | ICD-10-CM | POA: Diagnosis not present

## 2021-10-27 DIAGNOSIS — I2510 Atherosclerotic heart disease of native coronary artery without angina pectoris: Secondary | ICD-10-CM | POA: Diagnosis not present

## 2021-10-27 DIAGNOSIS — I10 Essential (primary) hypertension: Secondary | ICD-10-CM | POA: Diagnosis not present

## 2021-10-27 DIAGNOSIS — I214 Non-ST elevation (NSTEMI) myocardial infarction: Secondary | ICD-10-CM | POA: Diagnosis not present

## 2021-10-27 DIAGNOSIS — I63512 Cerebral infarction due to unspecified occlusion or stenosis of left middle cerebral artery: Secondary | ICD-10-CM | POA: Diagnosis not present

## 2021-10-27 DIAGNOSIS — I4510 Unspecified right bundle-branch block: Secondary | ICD-10-CM | POA: Diagnosis not present

## 2021-10-27 DIAGNOSIS — I252 Old myocardial infarction: Secondary | ICD-10-CM | POA: Diagnosis not present

## 2021-10-27 DIAGNOSIS — Z8673 Personal history of transient ischemic attack (TIA), and cerebral infarction without residual deficits: Secondary | ICD-10-CM | POA: Diagnosis not present

## 2021-10-27 DIAGNOSIS — I493 Ventricular premature depolarization: Secondary | ICD-10-CM | POA: Diagnosis not present

## 2021-10-27 DIAGNOSIS — R9439 Abnormal result of other cardiovascular function study: Secondary | ICD-10-CM | POA: Diagnosis not present

## 2021-10-27 DIAGNOSIS — I480 Paroxysmal atrial fibrillation: Secondary | ICD-10-CM | POA: Diagnosis not present

## 2021-10-27 DIAGNOSIS — Z7901 Long term (current) use of anticoagulants: Secondary | ICD-10-CM | POA: Diagnosis not present

## 2021-11-13 DIAGNOSIS — I471 Supraventricular tachycardia: Secondary | ICD-10-CM | POA: Diagnosis not present

## 2021-11-13 DIAGNOSIS — I480 Paroxysmal atrial fibrillation: Secondary | ICD-10-CM | POA: Diagnosis not present

## 2021-11-16 DIAGNOSIS — L03113 Cellulitis of right upper limb: Secondary | ICD-10-CM | POA: Diagnosis not present

## 2021-11-20 DIAGNOSIS — Z1231 Encounter for screening mammogram for malignant neoplasm of breast: Secondary | ICD-10-CM | POA: Diagnosis not present

## 2021-11-20 DIAGNOSIS — Z1239 Encounter for other screening for malignant neoplasm of breast: Secondary | ICD-10-CM | POA: Diagnosis not present

## 2021-12-26 ENCOUNTER — Telehealth: Payer: Self-pay | Admitting: *Deleted

## 2021-12-26 NOTE — Patient Outreach (Signed)
  Care Coordination   12/26/2021 Name: Kaitlyn Burke MRN: [id number] DOB: 1932-09-25   Care Coordination Outreach Attempts:  An unsuccessful telephone outreach was attempted today to offer the patient information about available care coordination services as a benefit of their health plan.   Follow Up Plan:  Additional outreach attempts will be made to offer the patient care coordination information and services.   Encounter Outcome:  No Answer  Care Coordination Interventions Activated:  No   Care Coordination Interventions:  No, not indicated    Jacqlyn Larsen [facility], [place] RN Care Coordinator [phone]

## 2021-12-27 ENCOUNTER — Encounter: Payer: Self-pay | Admitting: *Deleted

## 2021-12-27 ENCOUNTER — Telehealth: Payer: Self-pay | Admitting: *Deleted

## 2021-12-27 NOTE — Patient Outreach (Signed)
  Care Coordination   Initial Visit Note   12/27/2021 Name: Kaitlyn Burke MRN: [id number] DOB: [birth year]  Kaitlyn Burke is a 88 y.o. year old female who sees Swaney, Idelle Leech, MD for primary care. I spoke with  Misty Stanley by phone today.  What matters to the patients health and wellness today?  "to keep doing well at home"    Goals Addressed               This Visit's Progress     COMPLETED: "to keep doing well at home" (pt-stated)        Care Coordination Interventions: Patient interviewed about adult health maintenance status including  importance of yearly Annual Wellness Visit, pt reports she has had AWV this past year Provided education about importance of taking medications as prescribed Evaluation of current treatment plan related to hypertension self management and patient's adherence to plan as established by provider Advised patient, providing education and rationale, to monitor blood pressure daily and record, calling PCP for findings outside established parameters Discussed complications of poorly controlled blood pressure such as heart disease, stroke, circulatory complications, vision complications, kidney impairment, sexual dysfunction Assessed social determinant of health barriers Care coordination program explained, patient agreeable with today's outreach but declines any further outreach Patient reports her adult son lives with her and assists as needed, provides transportation, pt reports she checks blood pressure "on occasion"          SDOH assessments and interventions completed:  Yes  SDOH Interventions Today    Flowsheet Row Most Recent Value  SDOH Interventions   Food Insecurity Interventions Intervention Not Indicated  Transportation Interventions Intervention Not Indicated        Care Coordination Interventions Activated:  Yes  Care Coordination Interventions:  Yes, provided   Follow up plan: No further intervention required.   Encounter  Outcome:  Pt. Visit Completed   Jacqlyn Larsen [facility], BSN [facility] RN Care Coordinator [phone]

## 2021-12-28 ENCOUNTER — Telehealth: Payer: Self-pay | Admitting: *Deleted

## 2021-12-28 NOTE — Patient Outreach (Signed)
  Care Coordination   12/28/2021 Name: Kaitlyn Burke MRN: [id number] DOB: June 08, 1932   Care Coordination Outreach Attempts:  A third unsuccessful outreach was attempted today to offer the patient with information about available care coordination services as a benefit of their health plan.   Follow Up Plan:  No further outreach attempts will be made at this time. We have been unable to contact the patient to offer or enroll patient in care coordination services  Encounter Outcome:  No Answer  Care Coordination Interventions Activated:  No   Care Coordination Interventions:  No, not indicated    Jacqlyn Larsen [facility], BSN [facility] RN Care Coordinator [phone]

## 2022-01-15 DIAGNOSIS — I480 Paroxysmal atrial fibrillation: Secondary | ICD-10-CM | POA: Diagnosis not present

## 2022-01-15 DIAGNOSIS — E785 Hyperlipidemia, unspecified: Secondary | ICD-10-CM | POA: Diagnosis not present

## 2022-01-15 DIAGNOSIS — R413 Other amnesia: Secondary | ICD-10-CM | POA: Diagnosis not present

## 2022-01-15 DIAGNOSIS — I10 Essential (primary) hypertension: Secondary | ICD-10-CM | POA: Diagnosis not present
